# Patient Record
Sex: Female | Born: 1965
Health system: Southern US, Community
[De-identification: ages and names within clinical notes are randomized; demographics above are authoritative.]

## PROBLEM LIST (undated history)

## (undated) DIAGNOSIS — I341 Nonrheumatic mitral (valve) prolapse: Secondary | ICD-10-CM

## (undated) HISTORY — DX: Nonrheumatic mitral (valve) prolapse: I34.1

---

## 2000-11-21 HISTORY — PX: TUBAL LIGATION: SHX77

## 2004-02-24 ENCOUNTER — Other Ambulatory Visit: Admission: RE | Admit: 2004-02-24 | Discharge: 2004-02-24 | Payer: Self-pay | Admitting: Gynecology

## 2004-03-30 ENCOUNTER — Encounter: Admission: RE | Admit: 2004-03-30 | Discharge: 2004-03-30 | Payer: Self-pay | Admitting: Gynecology

## 2005-02-24 ENCOUNTER — Other Ambulatory Visit: Admission: RE | Admit: 2005-02-24 | Discharge: 2005-02-24 | Payer: Self-pay | Admitting: Gynecology

## 2005-03-31 ENCOUNTER — Encounter: Admission: RE | Admit: 2005-03-31 | Discharge: 2005-03-31 | Payer: Self-pay | Admitting: Gynecology

## 2006-04-03 ENCOUNTER — Encounter: Admission: RE | Admit: 2006-04-03 | Discharge: 2006-04-03 | Payer: Self-pay | Admitting: Gynecology

## 2006-10-02 ENCOUNTER — Encounter: Admission: RE | Admit: 2006-10-02 | Discharge: 2006-10-02 | Payer: Self-pay | Admitting: Gynecology

## 2007-03-06 ENCOUNTER — Other Ambulatory Visit: Admission: RE | Admit: 2007-03-06 | Discharge: 2007-03-06 | Payer: Self-pay | Admitting: Gynecology

## 2007-04-05 ENCOUNTER — Encounter: Admission: RE | Admit: 2007-04-05 | Discharge: 2007-04-05 | Payer: Self-pay | Admitting: Gynecology

## 2007-09-06 ENCOUNTER — Other Ambulatory Visit: Admission: RE | Admit: 2007-09-06 | Discharge: 2007-09-06 | Payer: Self-pay | Admitting: Gynecology

## 2007-09-11 ENCOUNTER — Encounter: Admission: RE | Admit: 2007-09-11 | Discharge: 2007-09-11 | Payer: Self-pay | Admitting: Gynecology

## 2008-03-17 ENCOUNTER — Other Ambulatory Visit: Admission: RE | Admit: 2008-03-17 | Discharge: 2008-03-17 | Payer: Self-pay | Admitting: Gynecology

## 2008-04-07 ENCOUNTER — Encounter: Admission: RE | Admit: 2008-04-07 | Discharge: 2008-04-07 | Payer: Self-pay | Admitting: Gynecology

## 2009-04-09 ENCOUNTER — Encounter: Admission: RE | Admit: 2009-04-09 | Discharge: 2009-04-09 | Payer: Self-pay | Admitting: Family Medicine

## 2009-05-13 ENCOUNTER — Encounter: Payer: Self-pay | Admitting: Cardiology

## 2009-07-07 ENCOUNTER — Other Ambulatory Visit: Admission: RE | Admit: 2009-07-07 | Discharge: 2009-07-07 | Payer: Self-pay | Admitting: Family Medicine

## 2010-04-15 ENCOUNTER — Encounter: Admission: RE | Admit: 2010-04-15 | Discharge: 2010-04-15 | Payer: Self-pay | Admitting: Family Medicine

## 2011-02-15 ENCOUNTER — Other Ambulatory Visit (HOSPITAL_COMMUNITY)
Admission: RE | Admit: 2011-02-15 | Discharge: 2011-02-15 | Disposition: A | Discharge status: Home / Self Care | Payer: Managed Care, Other (non HMO) | Source: Ambulatory Visit | Attending: Family Medicine | Admitting: Family Medicine

## 2011-02-15 ENCOUNTER — Other Ambulatory Visit: Payer: Self-pay | Admitting: Family Medicine

## 2011-02-15 DIAGNOSIS — Z124 Encounter for screening for malignant neoplasm of cervix: Secondary | ICD-10-CM | POA: Insufficient documentation

## 2011-03-08 ENCOUNTER — Other Ambulatory Visit: Payer: Self-pay | Admitting: Family Medicine

## 2011-03-08 DIAGNOSIS — Z1231 Encounter for screening mammogram for malignant neoplasm of breast: Secondary | ICD-10-CM

## 2011-04-19 ENCOUNTER — Ambulatory Visit
Admission: RE | Admit: 2011-04-19 | Discharge: 2011-04-19 | Disposition: A | Discharge status: Home / Self Care | Payer: Managed Care, Other (non HMO) | Source: Ambulatory Visit | Attending: Family Medicine | Admitting: Family Medicine

## 2011-04-19 DIAGNOSIS — Z1231 Encounter for screening mammogram for malignant neoplasm of breast: Secondary | ICD-10-CM

## 2012-03-16 ENCOUNTER — Other Ambulatory Visit: Payer: Self-pay | Admitting: Family Medicine

## 2012-03-16 DIAGNOSIS — Z1231 Encounter for screening mammogram for malignant neoplasm of breast: Secondary | ICD-10-CM

## 2012-04-19 ENCOUNTER — Ambulatory Visit
Admission: RE | Admit: 2012-04-19 | Discharge: 2012-04-19 | Disposition: A | Discharge status: Home / Self Care | Payer: Managed Care, Other (non HMO) | Source: Ambulatory Visit | Attending: Family Medicine | Admitting: Family Medicine

## 2012-04-19 DIAGNOSIS — Z1231 Encounter for screening mammogram for malignant neoplasm of breast: Secondary | ICD-10-CM

## 2013-02-18 ENCOUNTER — Other Ambulatory Visit: Payer: Self-pay

## 2013-02-18 DIAGNOSIS — Z1231 Encounter for screening mammogram for malignant neoplasm of breast: Secondary | ICD-10-CM

## 2013-04-18 ENCOUNTER — Ambulatory Visit
Admission: RE | Admit: 2013-04-18 | Discharge: 2013-04-18 | Disposition: A | Discharge status: Home / Self Care | Payer: BC Managed Care – PPO | Source: Ambulatory Visit

## 2013-04-18 DIAGNOSIS — Z1231 Encounter for screening mammogram for malignant neoplasm of breast: Secondary | ICD-10-CM

## 2013-10-25 ENCOUNTER — Encounter: Payer: Self-pay | Admitting: Cardiology

## 2014-02-04 ENCOUNTER — Other Ambulatory Visit: Payer: Self-pay

## 2014-02-04 DIAGNOSIS — Z1231 Encounter for screening mammogram for malignant neoplasm of breast: Secondary | ICD-10-CM

## 2014-02-20 ENCOUNTER — Other Ambulatory Visit: Payer: Self-pay | Admitting: Family Medicine

## 2014-02-20 ENCOUNTER — Other Ambulatory Visit (HOSPITAL_COMMUNITY)
Admission: RE | Admit: 2014-02-20 | Discharge: 2014-02-20 | Disposition: A | Discharge status: Home / Self Care | Payer: BC Managed Care – PPO | Source: Ambulatory Visit | Attending: Family Medicine | Admitting: Family Medicine

## 2014-02-20 DIAGNOSIS — Z124 Encounter for screening for malignant neoplasm of cervix: Secondary | ICD-10-CM | POA: Insufficient documentation

## 2014-02-20 DIAGNOSIS — Z1151 Encounter for screening for human papillomavirus (HPV): Secondary | ICD-10-CM | POA: Insufficient documentation

## 2014-02-20 DIAGNOSIS — Z0000 Encounter for general adult medical examination without abnormal findings: Secondary | ICD-10-CM | POA: Insufficient documentation

## 2014-04-21 ENCOUNTER — Encounter (INDEPENDENT_AMBULATORY_CARE_PROVIDER_SITE_OTHER): Payer: Self-pay

## 2014-04-21 ENCOUNTER — Ambulatory Visit
Admission: RE | Admit: 2014-04-21 | Discharge: 2014-04-21 | Disposition: A | Discharge status: Home / Self Care | Payer: BC Managed Care – PPO | Source: Ambulatory Visit

## 2014-04-21 DIAGNOSIS — Z1231 Encounter for screening mammogram for malignant neoplasm of breast: Secondary | ICD-10-CM

## 2014-12-16 ENCOUNTER — Telehealth: Payer: Self-pay | Admitting: Interventional Cardiology

## 2014-12-16 NOTE — Telephone Encounter (Signed)
Returned pt call. Pt ha spoken with her pcp Dr.Wendy Leonides Schanz she informed her that her last echo 5 yrs showed a very mild regurgatation, she does not think an echo needs to be repeated at this time unless the pt is having symptoms. Pt has cancelled her Consult appt with Dr.Smith for April. She will continue to be followed by her pcp. She will call back in the future to reschedule a new pt appt if symptoms develop, or if recommended by her pcp.

## 2014-12-16 NOTE — Telephone Encounter (Signed)
New message     Patient is a new patient and would like to know if she would need to have an echo before the appt.   Please call

## 2015-02-09 ENCOUNTER — Ambulatory Visit: Payer: Self-pay | Admitting: Interventional Cardiology

## 2015-02-18 ENCOUNTER — Other Ambulatory Visit: Payer: Self-pay

## 2015-02-18 DIAGNOSIS — Z1231 Encounter for screening mammogram for malignant neoplasm of breast: Secondary | ICD-10-CM

## 2015-03-02 ENCOUNTER — Ambulatory Visit: Payer: Self-pay | Admitting: Interventional Cardiology

## 2015-04-23 ENCOUNTER — Ambulatory Visit
Admission: RE | Admit: 2015-04-23 | Discharge: 2015-04-23 | Disposition: A | Discharge status: Home / Self Care | Payer: BLUE CROSS/BLUE SHIELD | Source: Ambulatory Visit

## 2015-04-23 DIAGNOSIS — Z1231 Encounter for screening mammogram for malignant neoplasm of breast: Secondary | ICD-10-CM

## 2015-12-01 ENCOUNTER — Encounter: Payer: Self-pay | Admitting: Interventional Cardiology

## 2015-12-01 ENCOUNTER — Ambulatory Visit (INDEPENDENT_AMBULATORY_CARE_PROVIDER_SITE_OTHER): Payer: BLUE CROSS/BLUE SHIELD | Admitting: Interventional Cardiology

## 2015-12-01 VITALS — BP 100/80 | HR 53 | Ht 69.0 in | Wt 140.8 lb

## 2015-12-01 DIAGNOSIS — F419 Anxiety disorder, unspecified: Secondary | ICD-10-CM | POA: Diagnosis not present

## 2015-12-01 DIAGNOSIS — R0600 Dyspnea, unspecified: Secondary | ICD-10-CM | POA: Diagnosis not present

## 2015-12-01 DIAGNOSIS — I340 Nonrheumatic mitral (valve) insufficiency: Secondary | ICD-10-CM | POA: Diagnosis not present

## 2015-12-01 NOTE — Patient Instructions (Signed)
Medication Instructions:  Your physician recommends that you continue on your current medications as directed. Please refer to the Current Medication list given to you today.   Labwork: None ordered  Testing/Procedures: Your physician has requested that you have an echocardiogram. Echocardiography is a painless test that uses sound waves to create images of your heart. It provides your doctor with information about the size and shape of your heart and how well your heart's chambers and valves are working. This procedure takes approximately one hour. There are no restrictions for this procedure.    Follow-Up: Your physician wants you to follow-up in: 1 year with Dr.Smith You will receive a reminder letter in the mail two months in advance. If you don't receive a letter, please call our office to schedule the follow-up appointment.   Any Other Special Instructions Will Be Listed Below (If Applicable).     If you need a refill on your cardiac medications before your next appointment, please call your pharmacy.   

## 2015-12-01 NOTE — Progress Notes (Signed)
Cardiology Office Note   Date:  12/01/2015   ID:  Christine Bell, DOB 1966/07/27, MRN [id number]  PCP:  Cari Caraway, MD  Cardiologist:  Sinclair Grooms, MD   Chief Complaint  Patient presents with  . Mitral Regurgitation      History of Present Illness: Christine Bell is a 50 y.o. female who presents for  Family history of mitral valve prolapse as well as a personal history of prolapse presenting with increased dyspnea.   Patient is doing well. She denies orthopnea, PND, lower extremity swelling, and chest discomfort. No significant palpitations. Long-standing history of mitral valve prolapse. Documented both possible palpitations and an echocardiogram. Last echo greater than 5 years ago. She denies chills and fever. Previously followed by Dr. Romeo Apple. She has never had syncope of prolonged palpitations.    Past Medical History  Diagnosis Date  . Mitral valve prolapse     Past Surgical History  Procedure Laterality Date  . Tubal ligation  2002     Current Outpatient Prescriptions  Medication Sig Dispense Refill  . FLUoxetine (PROZAC) 20 MG capsule Take 20 mg by mouth every morning.  0  . IBUPROFEN PO Take 600 mg by mouth 4 (four) times daily as needed. (PAIN)     No current facility-administered medications for this visit.    Allergies:   Bupropion    Social History:  The patient  reports that she has never smoked. She has never used smokeless tobacco.   Family History:  The patient's family history includes Arthritis in her mother; Breast cancer in her mother; Gout in her brother; Healthy in her brother, sister, and sister; Hyperlipidemia in her mother; Other in her father.  Father has mitral valve replacement due to mitral valve prolapse. Also history of atrial fibrillation.   ROS:  Please see the history of present illness.   Otherwise, review of systems are positive for father has a history of mitral valve prolapse with subsequent atrial  fibrillation and mitral valve replacement. History of hypertension other. Occasional depression and anxiety. Now back on serotonin uptake inhibitor therapy.   All other systems are reviewed and negative.    PHYSICAL EXAM: VS:  BP 100/80 mmHg  Pulse 53  Ht 5\' 9"  (1.753 m)  Wt 140 lb 12.8 oz (63.866 kg)  BMI 20.78 kg/m2 , BMI Body mass index is 20.78 kg/(m^2). GEN: Well nourished, well developed, in no acute distress HEENT: normal Neck: no JVD, carotid bruits, or masses Cardiac: RRR.  There  1-2 of 6 apical late systolic murmur and multiple late systolic clicks. Physiologic splitting of the S2. Marland Kitchen There is norub, or gallop. There is  no edema. Respiratory:  clear to auscultation bilaterally  withnormal work of breathing. GI: soft, nontender, nondistended, + BS MS: no deformity or atrophy Skin: warm and dry, no rash Neuro:  Strength and sensation are intact Psych: euthymic mood, full affect   EKG:  EKG is ordered today. The ekg reveals  Sinus bradycardia with R prime in V1. Normal conduction duration.   Recent Labs: No results found for requested labs within last 365 days.    Lipid Panel No results found for: CHOL, TRIG, HDL, CHOLHDL, VLDL, LDLCALC, LDLDIRECT    Wt Readings from Last 3 Encounters:  12/01/15 140 lb 12.8 oz (63.866 kg)      Other studies Reviewed: Additional studies/ records that were reviewed today include: None. The findings include none.    ASSESSMENT AND PLAN:  1. Mitral  regurgitation , related to mitral valve prolapse Classical auscultatory findings of mitral valve prolapse. No evidence of volume overload or CHF.   2. Anxiety disorder  3. Dyspnea No evidence of CHF or cardiac explanation.  Current medicines are reviewed at length with the patient today.  The patient has the following concerns regarding medicines: .  The following changes/actions have been instituted:    2-D Doppler echocardiogram  Further evaluation may be necessary depending  upon echo findings.  Labs/ tests ordered today include:  No orders of the defined types were placed in this encounter.     Disposition:   FU with HS in 1  year  Signed, Sinclair Grooms, MD  12/01/2015 10:36 AM    [facility] [place], [place], [place]  [address] Phone: [phone]; Fax: [phone]

## 2015-12-02 NOTE — Addendum Note (Signed)
Addended by: Freada Bergeron on: 12/02/2015 04:50 PM   Modules accepted: Orders

## 2015-12-14 ENCOUNTER — Other Ambulatory Visit: Payer: Self-pay

## 2015-12-14 ENCOUNTER — Ambulatory Visit (HOSPITAL_COMMUNITY): Payer: BLUE CROSS/BLUE SHIELD | Attending: Internal Medicine

## 2015-12-14 DIAGNOSIS — I071 Rheumatic tricuspid insufficiency: Secondary | ICD-10-CM | POA: Insufficient documentation

## 2015-12-14 DIAGNOSIS — I351 Nonrheumatic aortic (valve) insufficiency: Secondary | ICD-10-CM | POA: Diagnosis not present

## 2015-12-14 DIAGNOSIS — I340 Nonrheumatic mitral (valve) insufficiency: Secondary | ICD-10-CM | POA: Diagnosis not present

## 2015-12-14 DIAGNOSIS — I341 Nonrheumatic mitral (valve) prolapse: Secondary | ICD-10-CM | POA: Insufficient documentation

## 2015-12-14 DIAGNOSIS — I700 Atherosclerosis of aorta: Secondary | ICD-10-CM | POA: Insufficient documentation

## 2015-12-17 ENCOUNTER — Telehealth: Payer: Self-pay | Admitting: Interventional Cardiology

## 2015-12-17 NOTE — Telephone Encounter (Signed)
-----   Message from Belva Crome, MD sent at 12/15/2015 10:20 PM EST ----- Mild to moderate Mitral regurgitation due to MVP. Normal heart size. Mild increase in pulmonary artery pressure 43 mmHg Will need to follow and I suggest repeat echo in 1 year to follow pulmonary preesures.

## 2015-12-17 NOTE — Telephone Encounter (Signed)
Returned pt call.[email or web address] 

## 2015-12-17 NOTE — Telephone Encounter (Signed)
Mrs. Magdaleno is returning your call

## 2015-12-18 NOTE — Telephone Encounter (Signed)
F/u   Pt returning call from [place].

## 2015-12-18 NOTE — Telephone Encounter (Signed)
Called pt and reviewed results. Questions asked and answered. Pt will call with further needs.

## 2015-12-21 ENCOUNTER — Other Ambulatory Visit: Payer: Self-pay

## 2015-12-21 DIAGNOSIS — I341 Nonrheumatic mitral (valve) prolapse: Secondary | ICD-10-CM

## 2016-02-24 ENCOUNTER — Other Ambulatory Visit (HOSPITAL_COMMUNITY): Payer: Self-pay | Admitting: Family Medicine

## 2016-02-24 DIAGNOSIS — N938 Other specified abnormal uterine and vaginal bleeding: Secondary | ICD-10-CM

## 2016-02-26 ENCOUNTER — Ambulatory Visit (HOSPITAL_COMMUNITY)
Admission: RE | Admit: 2016-02-26 | Discharge: 2016-02-26 | Disposition: A | Discharge status: Home / Self Care | Payer: BLUE CROSS/BLUE SHIELD | Source: Ambulatory Visit | Attending: Family Medicine | Admitting: Family Medicine

## 2016-02-26 DIAGNOSIS — N938 Other specified abnormal uterine and vaginal bleeding: Secondary | ICD-10-CM | POA: Diagnosis not present

## 2016-02-26 DIAGNOSIS — D251 Intramural leiomyoma of uterus: Secondary | ICD-10-CM | POA: Insufficient documentation

## 2016-03-11 ENCOUNTER — Other Ambulatory Visit: Payer: Self-pay

## 2016-03-11 DIAGNOSIS — Z1231 Encounter for screening mammogram for malignant neoplasm of breast: Secondary | ICD-10-CM

## 2016-04-25 ENCOUNTER — Ambulatory Visit
Admission: RE | Admit: 2016-04-25 | Discharge: 2016-04-25 | Disposition: A | Discharge status: Home / Self Care | Payer: BLUE CROSS/BLUE SHIELD | Source: Ambulatory Visit

## 2016-04-25 DIAGNOSIS — Z1231 Encounter for screening mammogram for malignant neoplasm of breast: Secondary | ICD-10-CM

## 2016-09-12 DIAGNOSIS — F3342 Major depressive disorder, recurrent, in full remission: Secondary | ICD-10-CM | POA: Diagnosis not present

## 2016-09-12 DIAGNOSIS — I341 Nonrheumatic mitral (valve) prolapse: Secondary | ICD-10-CM | POA: Diagnosis not present

## 2016-09-12 DIAGNOSIS — Z0000 Encounter for general adult medical examination without abnormal findings: Secondary | ICD-10-CM | POA: Diagnosis not present

## 2016-10-19 DIAGNOSIS — H11442 Conjunctival cysts, left eye: Secondary | ICD-10-CM | POA: Diagnosis not present

## 2016-10-28 DIAGNOSIS — H11442 Conjunctival cysts, left eye: Secondary | ICD-10-CM | POA: Diagnosis not present

## 2016-10-28 DIAGNOSIS — H11152 Pinguecula, left eye: Secondary | ICD-10-CM | POA: Diagnosis not present

## 2016-12-08 ENCOUNTER — Other Ambulatory Visit (HOSPITAL_COMMUNITY): Payer: BLUE CROSS/BLUE SHIELD

## 2016-12-09 ENCOUNTER — Ambulatory Visit (HOSPITAL_COMMUNITY)
Admission: RE | Admit: 2016-12-09 | Discharge: 2016-12-09 | Disposition: A | Discharge status: Home / Self Care | Payer: BLUE CROSS/BLUE SHIELD | Source: Ambulatory Visit | Attending: Interventional Cardiology | Admitting: Interventional Cardiology

## 2016-12-09 ENCOUNTER — Other Ambulatory Visit (HOSPITAL_COMMUNITY): Payer: BLUE CROSS/BLUE SHIELD

## 2016-12-09 DIAGNOSIS — I341 Nonrheumatic mitral (valve) prolapse: Secondary | ICD-10-CM

## 2016-12-09 DIAGNOSIS — I351 Nonrheumatic aortic (valve) insufficiency: Secondary | ICD-10-CM | POA: Diagnosis not present

## 2016-12-09 NOTE — Progress Notes (Signed)
  Echocardiogram 2D Echocardiogram has been performed.  Jennette Dubin 12/09/2016, 3:52 PM

## 2016-12-13 ENCOUNTER — Ambulatory Visit: Payer: BLUE CROSS/BLUE SHIELD | Admitting: Interventional Cardiology

## 2016-12-13 ENCOUNTER — Ambulatory Visit (INDEPENDENT_AMBULATORY_CARE_PROVIDER_SITE_OTHER): Payer: BLUE CROSS/BLUE SHIELD | Admitting: Interventional Cardiology

## 2016-12-13 ENCOUNTER — Encounter: Payer: Self-pay | Admitting: Interventional Cardiology

## 2016-12-13 ENCOUNTER — Encounter (INDEPENDENT_AMBULATORY_CARE_PROVIDER_SITE_OTHER): Payer: Self-pay

## 2016-12-13 VITALS — BP 110/74 | HR 53 | Ht 69.0 in | Wt 146.2 lb

## 2016-12-13 DIAGNOSIS — F064 Anxiety disorder due to known physiological condition: Secondary | ICD-10-CM | POA: Diagnosis not present

## 2016-12-13 DIAGNOSIS — I340 Nonrheumatic mitral (valve) insufficiency: Secondary | ICD-10-CM

## 2016-12-13 NOTE — Patient Instructions (Signed)

## 2016-12-13 NOTE — Progress Notes (Signed)
Cardiology Office Note    Date:  12/13/2016   ID:  ANAILAH MOKRY, DOB 05/26/1966, MRN [id number]  PCP:  Cari Caraway, MD  Cardiologist: Sinclair Grooms, MD   Chief Complaint  Patient presents with  . Cardiac Valve Problem    History of Present Illness:  Christine Bell is a 51 y.o. female presents for Family history of mitral valve prolapse as well as a personal history of prolapse presenting with increased dyspnea.  She has not been exercising regularly. She denies any cardiopulmonary complaints. The recent echocardiogram detailed below was very reassuring   Past Medical History:  Diagnosis Date  . Mitral valve prolapse     Past Surgical History:  Procedure Laterality Date  . TUBAL LIGATION  2002    Current Medications: Outpatient Medications Prior to Visit  Medication Sig Dispense Refill  . FLUoxetine (PROZAC) 20 MG capsule Take 20 mg by mouth every morning.  0  . IBUPROFEN PO Take 600 mg by mouth 4 (four) times daily as needed. (PAIN)     No facility-administered medications prior to visit.      Allergies:   Bupropion   Social History   Social History  . Marital status: Married    Spouse name: N/A  . Number of children: N/A  . Years of education: N/A   Social History Main Topics  . Smoking status: Never Smoker  . Smokeless tobacco: Never Used  . Alcohol use None  . Drug use: Unknown  . Sexual activity: Not Asked   Other Topics Concern  . None   Social History Narrative  . None     Family History:  The patient's family history includes Arthritis in her mother; Breast cancer in her mother; Gout in her brother; Healthy in her brother, sister, and sister; Hyperlipidemia in her mother; Other in her father.   ROS:   Please see the history of present illness.    Discouraged that she has become relatively sedentary and promises to improve her level of activity.  All other systems reviewed and are negative.   PHYSICAL EXAM:   VS:  BP 110/74 (BP  Location: Right Arm)   Pulse (!) 53   Ht 5\' 9"  (1.753 m)   Wt 146 lb 3.2 oz (66.3 kg)   BMI 21.59 kg/m    GEN: Well nourished, well developed, in no acute distress  HEENT: normal  Neck: no JVD, carotid bruits, or masses Cardiac: RRR; no murmurs, rubs, or gallops,no edema . There are multiple mid to late systolic clicks without a murmur being audible. Respiratory:  clear to auscultation bilaterally, normal work of breathing GI: soft, nontender, nondistended, + BS MS: no deformity or atrophy  Skin: warm and dry, no rash Neuro:  Alert and Oriented x 3, Strength and sensation are intact Psych: euthymic mood, full affect  Wt Readings from Last 3 Encounters:  12/13/16 146 lb 3.2 oz (66.3 kg)  12/01/15 140 lb 12.8 oz (63.9 kg)      Studies/Labs Reviewed:   EKG:  EKG  Sinus bradycardia at 53 bpm. Otherwise unremarkable.  Recent Labs: No results found for requested labs within last 8760 hours.   Lipid Panel No results found for: CHOL, TRIG, HDL, CHOLHDL, VLDL, LDLCALC, LDLDIRECT  Additional studies/ records that were reviewed today include:   Echocardiogram 12/09/16: Study Conclusions  - Left ventricle: The cavity size was normal. Wall thickness was   normal. Systolic function was normal. The estimated ejection   fraction was  in the range of 55% to 60%. Wall motion was normal;   there were no regional wall motion abnormalities. Left   ventricular diastolic function parameters were normal. - Aortic valve: There was trivial regurgitation. - Mitral valve: Prolapse. Mild prolapse, involving the anterior   leaflet and the posterior leaflet.   ASSESSMENT:    1. 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 Mitral valve prolapse is documented by the recent echo. No significant regurgitation is noted. Bileaflet prolapse is noted. We discussed the natural history of mitral valve  prolapse. It is a positive prognostic indicator that she has no regurgitation at this time in her life. We did discuss the possibility of chordal rupture. Return in one year follow auscultation and clinical follow-up. Return earlier if developed sudden shortness of breath.    Medication Adjustments/Labs and Tests Ordered: Current medicines are reviewed at length with the patient today.  Concerns regarding medicines are outlined above.  Medication changes, Labs and Tests ordered today are listed in the Patient Instructions below. There are no Patient Instructions on file for this visit.   Signed, Sinclair Grooms, MD  12/13/2016 9:04 AM    [facility] [place], [place], [place]  [address] Phone: [phone]; Fax: [phone]

## 2016-12-22 ENCOUNTER — Other Ambulatory Visit (HOSPITAL_COMMUNITY): Payer: BLUE CROSS/BLUE SHIELD

## 2017-03-10 DIAGNOSIS — F341 Dysthymic disorder: Secondary | ICD-10-CM | POA: Diagnosis not present

## 2017-03-16 ENCOUNTER — Other Ambulatory Visit: Payer: Self-pay | Admitting: Family Medicine

## 2017-03-16 DIAGNOSIS — Z1231 Encounter for screening mammogram for malignant neoplasm of breast: Secondary | ICD-10-CM

## 2017-05-01 ENCOUNTER — Ambulatory Visit
Admission: RE | Admit: 2017-05-01 | Discharge: 2017-05-01 | Disposition: A | Discharge status: Home / Self Care | Payer: BLUE CROSS/BLUE SHIELD | Source: Ambulatory Visit | Attending: Family Medicine | Admitting: Family Medicine

## 2017-05-01 DIAGNOSIS — Z1231 Encounter for screening mammogram for malignant neoplasm of breast: Secondary | ICD-10-CM | POA: Diagnosis not present

## 2017-07-20 DIAGNOSIS — F341 Dysthymic disorder: Secondary | ICD-10-CM | POA: Diagnosis not present

## 2017-09-13 DIAGNOSIS — Z13220 Encounter for screening for lipoid disorders: Secondary | ICD-10-CM | POA: Diagnosis not present

## 2017-09-13 DIAGNOSIS — Z130 Encounter for screening for diseases of the blood and blood-forming organs and certain disorders involving the immune mechanism: Secondary | ICD-10-CM | POA: Diagnosis not present

## 2017-09-13 DIAGNOSIS — Z0000 Encounter for general adult medical examination without abnormal findings: Secondary | ICD-10-CM | POA: Diagnosis not present

## 2017-09-13 DIAGNOSIS — Z131 Encounter for screening for diabetes mellitus: Secondary | ICD-10-CM | POA: Diagnosis not present

## 2018-03-06 DIAGNOSIS — F341 Dysthymic disorder: Secondary | ICD-10-CM | POA: Diagnosis not present

## 2018-03-23 ENCOUNTER — Other Ambulatory Visit: Payer: Self-pay | Admitting: Family Medicine

## 2018-03-23 DIAGNOSIS — Z1231 Encounter for screening mammogram for malignant neoplasm of breast: Secondary | ICD-10-CM

## 2018-05-04 ENCOUNTER — Ambulatory Visit
Admission: RE | Admit: 2018-05-04 | Discharge: 2018-05-04 | Disposition: A | Discharge status: Home / Self Care | Payer: BLUE CROSS/BLUE SHIELD | Source: Ambulatory Visit | Attending: Family Medicine | Admitting: Family Medicine

## 2018-05-04 DIAGNOSIS — Z1231 Encounter for screening mammogram for malignant neoplasm of breast: Secondary | ICD-10-CM

## 2018-08-20 DIAGNOSIS — D485 Neoplasm of uncertain behavior of skin: Secondary | ICD-10-CM | POA: Diagnosis not present

## 2018-08-20 DIAGNOSIS — C44319 Basal cell carcinoma of skin of other parts of face: Secondary | ICD-10-CM | POA: Diagnosis not present

## 2018-08-20 DIAGNOSIS — L570 Actinic keratosis: Secondary | ICD-10-CM | POA: Diagnosis not present

## 2018-08-20 DIAGNOSIS — L821 Other seborrheic keratosis: Secondary | ICD-10-CM | POA: Diagnosis not present

## 2018-08-24 DIAGNOSIS — F341 Dysthymic disorder: Secondary | ICD-10-CM | POA: Diagnosis not present

## 2018-09-21 ENCOUNTER — Other Ambulatory Visit: Payer: Self-pay | Admitting: Family Medicine

## 2018-09-21 ENCOUNTER — Other Ambulatory Visit (HOSPITAL_COMMUNITY)
Admission: RE | Admit: 2018-09-21 | Discharge: 2018-09-21 | Disposition: A | Discharge status: Home / Self Care | Payer: BLUE CROSS/BLUE SHIELD | Source: Ambulatory Visit | Attending: Family Medicine | Admitting: Family Medicine

## 2018-09-21 DIAGNOSIS — Z124 Encounter for screening for malignant neoplasm of cervix: Secondary | ICD-10-CM | POA: Insufficient documentation

## 2018-09-21 DIAGNOSIS — Z0000 Encounter for general adult medical examination without abnormal findings: Secondary | ICD-10-CM | POA: Diagnosis not present

## 2018-09-21 DIAGNOSIS — Z23 Encounter for immunization: Secondary | ICD-10-CM | POA: Diagnosis not present

## 2018-09-25 LAB — CYTOLOGY - PAP
Diagnosis: NEGATIVE
HPV: NOT DETECTED

## 2018-09-27 DIAGNOSIS — C441192 Basal cell carcinoma of skin of left lower eyelid, including canthus: Secondary | ICD-10-CM | POA: Diagnosis not present

## 2018-10-24 DIAGNOSIS — T1511XA Foreign body in conjunctival sac, right eye, initial encounter: Secondary | ICD-10-CM | POA: Diagnosis not present

## 2018-11-26 DIAGNOSIS — F341 Dysthymic disorder: Secondary | ICD-10-CM | POA: Diagnosis not present

## 2019-03-22 ENCOUNTER — Telehealth: Payer: Self-pay | Admitting: Interventional Cardiology

## 2019-03-22 NOTE — Telephone Encounter (Signed)
New message:    Patient calling to see if she need to come in sooner for a appt patient is not feeling well. Please call patient.

## 2019-03-22 NOTE — Telephone Encounter (Signed)
Spoke with pt and she states that she has recently noticed SOB when climbing stairs and when she feels her heartbeat she feels "skips" sporadically.  Denies CP, dizziness, lightheadedness, SOB at rest or other cardiac issues.  States she also gets SOB when laying on her stomach but no issues on back or side.  Denies swelling or weight change.  States no issues when walking around or doing yard work.  Pt inquired about doing a virtual visit sooner than her scheduled Aug appt.  Scheduled pt for video visit with Dr. Tamala Julian on 03/27/2019.  Pt appreciative for call.

## 2019-03-26 ENCOUNTER — Other Ambulatory Visit: Payer: Self-pay | Admitting: Family Medicine

## 2019-03-26 ENCOUNTER — Telehealth: Payer: Self-pay

## 2019-03-26 DIAGNOSIS — Z1231 Encounter for screening mammogram for malignant neoplasm of breast: Secondary | ICD-10-CM

## 2019-03-26 NOTE — Progress Notes (Signed)
Virtual Visit via Video Note   This visit type was conducted due to national recommendations for restrictions regarding the COVID-19 Pandemic (e.g. social distancing) in an effort to limit this patient's exposure and mitigate transmission in our community.  Due to her co-morbid illnesses, this patient is at least at moderate risk for complications without adequate follow up.  This format is felt to be most appropriate for this patient at this time.  All issues noted in this document were discussed and addressed.  A limited physical exam was performed with this format.  Please refer to the patient's chart for her consent to telehealth for [facility].   Date:  03/26/2019   ID:  Christine Bell, DOB 08-26-1966, MRN [id number]  Patient Location: Home Provider Location: Office  PCP:  Cari Caraway, MD  Cardiologist:  No primary care provider on file.  Electrophysiologist:  None   Evaluation Performed:  Follow-Up Visit  Chief Complaint:  Palpitations. MVP  History of Present Illness:    Christine Bell is a 53 y.o. female with Family history of mitral valve prolapse as well as a personal history of prolapse presenting with increased dyspnea. Now with c/o palpitations.  Kimiyah has a history of mitral valve prolapse.  Over the last 2 weeks she has noted irregular heartbeat and racing heart when she climbs stairs that she ordinarily ambulates without difficulty.  She subsequently has auscultated her heart and realizes that her heart rhythm is irregular.  This caused her to have concern and is the reason for today's visit.  She denies orthopnea, PND, ankle swelling, chest pain, fainting, and tachycardia.  The patient does have symptoms concerning for COVID-19 infection (fever, chills, cough, or new shortness of breath).    Past Medical History:  Diagnosis Date  . Mitral valve prolapse    Past Surgical History:  Procedure Laterality Date  . TUBAL LIGATION  2002     No outpatient  medications have been marked as taking for the 03/27/19 encounter (Appointment) with Belva Crome, MD.     Allergies:   Bupropion   Social History   Tobacco Use  . Smoking status: Never Smoker  . Smokeless tobacco: Never Used  Substance Use Topics  . Alcohol use: Not on file  . Drug use: Not on file     Family Hx: The patient's family history includes Arthritis in her mother; Breast cancer in her mother; Gout in her brother; Healthy in her brother, sister, and sister; Hyperlipidemia in her mother; Other in her father.  ROS:   Please see the history of present illness.    She otherwise feels well. All other systems reviewed and are negative.   Prior CV studies:   The following studies were reviewed today:  ECHOCARDIOGRAM 12/09/2016: Study Conclusions  - Left ventricle: The cavity size was normal. Wall thickness was   normal. Systolic function was normal. The estimated ejection   fraction was in the range of 55% to 60%. Wall motion was normal;   there were no regional wall motion abnormalities. Left   ventricular diastolic function parameters were normal. - Aortic valve: There was trivial regurgitation. - Mitral valve: Prolapse. Mild prolapse, involving the anterior   leaflet and the posterior leaflet.  Labs/Other Tests and Data Reviewed:    EKG:  No ECG reviewed.  Recent Labs: No results found for requested labs within last 8760 hours.   Recent Lipid Panel No results found for: CHOL, TRIG, HDL, CHOLHDL, LDLCALC, LDLDIRECT  Wt  Readings from Last 3 Encounters:  12/13/16 146 lb 3.2 oz (66.3 kg)  12/01/15 140 lb 12.8 oz (63.9 kg)     Objective:    Vital Signs:  There were no vitals taken for this visit.   VITAL SIGNS:  reviewed GEN:  no acute distress RESPIRATORY:  normal respiratory effort, symmetric expansion CARDIOVASCULAR:  no peripheral edema NEURO:  alert and oriented x 3, no obvious focal deficit  ASSESSMENT & PLAN:    1. Nonrheumatic mitral valve  regurgitation   2. Palpitations   3. 2019 novel coronavirus disease (COVID-19)    PLAN:  1. 2D Doppler echo to assess mitral valve and rule out progression of regurgitation. 2. 14-day monitor with the patient participating in her usual activities to rule out atrial fibrillation or other arrhythmias.  Plan follow-up office visit once daily is back.  COVID-19 Education: The signs and symptoms of COVID-19 were discussed with the patient and how to seek care for testing (follow up with PCP or arrange E-visit).  The importance of social distancing was discussed today.  Time:   Today, I have spent 20 minutes with the patient with telehealth technology discussing the above problems.     Medication Adjustments/Labs and Tests Ordered: Current medicines are reviewed at length with the patient today.  Concerns regarding medicines are outlined above.   Tests Ordered: No orders of the defined types were placed in this encounter.   Medication Changes: No orders of the defined types were placed in this encounter.   Disposition:  Follow up in 6 week(s)  Signed, Sinclair Grooms, MD  03/26/2019 9:17 PM    [facility]

## 2019-03-26 NOTE — Telephone Encounter (Signed)
YOUR CARDIOLOGY TEAM HAS ARRANGED FOR AN E-VISIT FOR YOUR APPOINTMENT - PLEASE REVIEW IMPORTANT INFORMATION BELOW SEVERAL DAYS PRIOR TO YOUR APPOINTMENT  Due to the recent COVID-19 pandemic, we are transitioning in-person office visits to tele-medicine visits in an effort to decrease unnecessary exposure to our patients, their families, and staff. These visits are billed to your insurance just like a normal visit is. We also encourage you to sign up for MyChart if you have not already done so. You will need a smartphone if possible. For patients that do not have this, we can still complete the visit using a regular telephone but do prefer a smartphone to enable video when possible. You may have a family member that lives with you that can help. If possible, we also ask that you have a blood pressure cuff and scale at home to measure your blood pressure, heart rate and weight prior to your scheduled appointment. Patients with clinical needs that need an in-person evaluation and testing will still be able to come to the office if absolutely necessary. If you have any questions, feel free to call our office.     YOUR PROVIDER WILL BE USING THE FOLLOWING PLATFORM TO COMPLETE YOUR VISIT: Doximity  . IF USING MYCHART - How to Download the MyChart App to Your SmartPhone   - If Apple, go to App Store and type in MyChart in the search bar and download the app. If Android, ask patient to go to Google Play Store and type in MyChart in the search bar and download the app. The app is free but as with any other app downloads, your phone may require you to verify saved payment information or Apple/Android password.  - You will need to then log into the app with your MyChart username and password, and select [facility] as your healthcare provider to link the account.  - When it is time for your visit, go to the MyChart app, find appointments, and click Begin Video Visit. Be sure to Select Allow for your device to  access the Microphone and Camera for your visit. You will then be connected, and your provider will be with you shortly.  **If you have any issues connecting or need assistance, please contact MyChart service desk [phone] ([phone])**  **If using a computer, in order to ensure the best quality for your visit, you will need to use either of the following Internet Browsers: Google Chrome or Microsoft Edge**  . IF USING DOXIMITY or DOXY.ME - The staff will give you instructions on receiving your link to join the meeting the day of your visit.      2-3 DAYS BEFORE YOUR APPOINTMENT  You will receive a telephone call from one of our [facility] team members - your caller ID may say "Unknown caller." If this is a video visit, we will walk you through how to get the video launched on your phone. We will remind you check your blood pressure, heart rate and weight prior to your scheduled appointment. If you have an Apple Watch or Kardia, please upload any pertinent ECG strips the day before or morning of your appointment to MyChart. Our staff will also make sure you have reviewed the consent and agree to move forward with your scheduled tele-health visit.     THE DAY OF YOUR APPOINTMENT  Approximately 15 minutes prior to your scheduled appointment, you will receive a telephone call from one of HeartCare team - your caller ID may say "Unknown caller."    Our staff will confirm medications, vital signs for the day and any symptoms you may be experiencing. Please have this information available prior to the time of visit start. It may also be helpful for you to have a pad of paper and pen handy for any instructions given during your visit. They will also walk you through joining the smartphone meeting if this is a video visit.    CONSENT FOR TELE-HEALTH VISIT - PLEASE REVIEW  I hereby voluntarily request, consent and authorize [facility] and its employed or contracted physicians, physician  assistants, nurse practitioners or other licensed health care professionals (the Practitioner), to provide me with telemedicine health care services (the "Services") as deemed necessary by the treating Practitioner. I acknowledge and consent to receive the Services by the Practitioner via telemedicine. I understand that the telemedicine visit will involve communicating with the Practitioner through live audiovisual communication technology and the disclosure of certain medical information by electronic transmission. I acknowledge that I have been given the opportunity to request an in-person assessment or other available alternative prior to the telemedicine visit and am voluntarily participating in the telemedicine visit.  I understand that I have the right to withhold or withdraw my consent to the use of telemedicine in the course of my care at any time, without affecting my right to future care or treatment, and that the Practitioner or I may terminate the telemedicine visit at any time. I understand that I have the right to inspect all information obtained and/or recorded in the course of the telemedicine visit and may receive copies of available information for a reasonable fee.  I understand that some of the potential risks of receiving the Services via telemedicine include:  . Delay or interruption in medical evaluation due to technological equipment failure or disruption; . Information transmitted may not be sufficient (e.g. poor resolution of images) to allow for appropriate medical decision making by the Practitioner; and/or  . In rare instances, security protocols could fail, causing a breach of personal health information.  Furthermore, I acknowledge that it is my responsibility to provide information about my medical history, conditions and care that is complete and accurate to the best of my ability. I acknowledge that Practitioner's advice, recommendations, and/or decision may be based on  factors not within their control, such as incomplete or inaccurate data provided by me or distortions of diagnostic images or specimens that may result from electronic transmissions. I understand that the practice of medicine is not an exact science and that Practitioner makes no warranties or guarantees regarding treatment outcomes. I acknowledge that I will receive a copy of this consent concurrently upon execution via email to the email address I last provided but may also request a printed copy by calling the office of [facility].    I understand that my insurance will be billed for this visit.   I have read or had this consent read to me. . I understand the contents of this consent, which adequately explains the benefits and risks of the Services being provided via telemedicine.  . I have been provided ample opportunity to ask questions regarding this consent and the Services and have had my questions answered to my satisfaction. . I give my informed consent for the services to be provided through the use of telemedicine in my medical care  By participating in this telemedicine visit I agree to the above.  

## 2019-03-27 ENCOUNTER — Telehealth (INDEPENDENT_AMBULATORY_CARE_PROVIDER_SITE_OTHER): Payer: BLUE CROSS/BLUE SHIELD | Admitting: Interventional Cardiology

## 2019-03-27 ENCOUNTER — Telehealth: Payer: Self-pay | Admitting: Radiology

## 2019-03-27 ENCOUNTER — Other Ambulatory Visit: Payer: Self-pay

## 2019-03-27 ENCOUNTER — Encounter: Payer: Self-pay | Admitting: Interventional Cardiology

## 2019-03-27 VITALS — BP 101/60 | HR 56 | Ht 69.0 in | Wt 150.0 lb

## 2019-03-27 DIAGNOSIS — R002 Palpitations: Secondary | ICD-10-CM

## 2019-03-27 DIAGNOSIS — U071 COVID-19: Secondary | ICD-10-CM

## 2019-03-27 DIAGNOSIS — I340 Nonrheumatic mitral (valve) insufficiency: Secondary | ICD-10-CM

## 2019-03-27 NOTE — Telephone Encounter (Signed)
Enrolled patient for a 14 day [employer] monitor to be mailed. Patient knows to expect monitor to arrive in 2-3 days

## 2019-03-27 NOTE — Addendum Note (Signed)
Addended by: Loren Racer on: 03/27/2019 04:36 PM   Modules accepted: Orders

## 2019-03-27 NOTE — Patient Instructions (Signed)
Medication Instructions:  Your physician recommends that you continue on your current medications as directed. Please refer to the Current Medication list given to you today.  If you need a refill on your cardiac medications before your next appointment, please call your pharmacy.   Lab work: None If you have labs (blood work) drawn today and your tests are completely normal, you will receive your results only by: Marland Kitchen MyChart Message (if you have MyChart) OR . A paper copy in the mail If you have any lab test that is abnormal or we need to change your treatment, we will call you to review the results.  Testing/Procedures: Your physician has requested that you have an echocardiogram. Echocardiography is a painless test that uses sound waves to create images of your heart. It provides your doctor with information about the size and shape of your heart and how well your heart's chambers and valves are working. This procedure takes approximately one hour. There are no restrictions for this procedure.  Your physician has recommended that you wear an 14 day event monitor. Event monitors are medical devices that record the heart's electrical activity. Doctors most often [place] these monitors to diagnose arrhythmias. Arrhythmias are problems with the speed or rhythm of the heartbeat. The monitor is a small, portable device. You can wear one while you do your normal daily activities. This is usually used to diagnose what is causing palpitations/syncope (passing out).    Follow-Up: Keep August appointment  Any Other Special Instructions Will Be Listed Below (If Applicable).

## 2019-04-02 ENCOUNTER — Ambulatory Visit (INDEPENDENT_AMBULATORY_CARE_PROVIDER_SITE_OTHER): Payer: BLUE CROSS/BLUE SHIELD

## 2019-04-02 DIAGNOSIS — R002 Palpitations: Secondary | ICD-10-CM | POA: Diagnosis not present

## 2019-04-24 DIAGNOSIS — F341 Dysthymic disorder: Secondary | ICD-10-CM | POA: Diagnosis not present

## 2019-04-29 ENCOUNTER — Telehealth (HOSPITAL_COMMUNITY): Payer: Self-pay | Admitting: *Deleted

## 2019-04-29 NOTE — Telephone Encounter (Signed)
Left Detailed Message Per DPR  Including the Following:    COVID-19 Pre-Screening Questions:  . Do you currently have a fever?  (yes = cancel and refer to pcp for e-visit) . Have you recently travelled on a cruise, internationally, or to [place], [place], [place], [place], [place], or [place], [place] [employer]) ?  (yes = cancel, stay home, monitor symptoms, and contact pcp or initiate e-visit if symptoms develop) . Have you been in contact with someone that is currently pending confirmation of Covid19 testing or has been confirmed to have the Damar virus?   (yes = cancel, stay home, away from tested individual, monitor symptoms, and contact pcp or initiate e-visit if symptoms develop) . Are you currently experiencing fatigue or cough?  (yes = pt should be prepared to have a mask placed at the time of their visit).   . Reiterated no additional visitors. Eartha Inch no earlier than 15 minutes before appointment time. . Please bring own mask.  Junella Domke

## 2019-04-30 ENCOUNTER — Ambulatory Visit (HOSPITAL_COMMUNITY): Payer: BLUE CROSS/BLUE SHIELD | Attending: Cardiovascular Disease

## 2019-04-30 ENCOUNTER — Other Ambulatory Visit: Payer: Self-pay

## 2019-04-30 DIAGNOSIS — R002 Palpitations: Secondary | ICD-10-CM | POA: Insufficient documentation

## 2019-04-30 DIAGNOSIS — I340 Nonrheumatic mitral (valve) insufficiency: Secondary | ICD-10-CM | POA: Diagnosis not present

## 2019-05-22 ENCOUNTER — Ambulatory Visit: Payer: BLUE CROSS/BLUE SHIELD

## 2019-06-20 DIAGNOSIS — F341 Dysthymic disorder: Secondary | ICD-10-CM | POA: Diagnosis not present

## 2019-07-05 ENCOUNTER — Other Ambulatory Visit: Payer: Self-pay

## 2019-07-05 ENCOUNTER — Ambulatory Visit
Admission: RE | Admit: 2019-07-05 | Discharge: 2019-07-05 | Disposition: A | Discharge status: Home / Self Care | Payer: BC Managed Care – PPO | Source: Ambulatory Visit | Attending: Family Medicine | Admitting: Family Medicine

## 2019-07-05 DIAGNOSIS — Z1231 Encounter for screening mammogram for malignant neoplasm of breast: Secondary | ICD-10-CM

## 2019-07-16 NOTE — Progress Notes (Signed)
Cardiology Office Note:    Date:  07/17/2019   ID:  Christine Bell, DOB [birth year], MRN [id number]  PCP:  Cari Caraway, MD  Cardiologist:  Sinclair Grooms, MD   Referring MD: Cari Caraway, MD   Chief Complaint  Patient presents with  . Cardiac Valve Problem    Mitral valve prolapse    History of Present Illness:    Christine Bell is a 53 y.o. female with a hx of family history of mitral valve prolapse as well as a personal history of prolapse presenting with increased dyspnea. Now with c/o palpitations.  Christine Bell is doing well.  She has had palpitations.  These seem to correlate with isolated PVCs.  She has mitral valve prolapse.  No significant mitral regurgitation.  She has occasional dyspnea but is not related to mitral valve disease.  We discussed this in some detail.  She has never had syncope.  She denies orthopnea.  There is no peripheral edema.  No history of atrial fibrillation.  We discussed indications for mitral valve surgery.  A must is that there would need to be significant mitral valve regurgitation which she did not have.  Other indicators are development of atrial fibrillation in the presence of significant mitral regurgitation, LV cavity enlargement, decrease EF, etc.   Past Medical History:  Diagnosis Date  . Mitral valve prolapse     Past Surgical History:  Procedure Laterality Date  . TUBAL LIGATION  2002    Current Medications: Current Meds  Medication Sig  . FLUoxetine (PROZAC) 20 MG capsule Take 40 mg by mouth daily.   . IBUPROFEN PO Take 600 mg by mouth 4 (four) times daily as needed. (PAIN)     Allergies:   Bupropion   Social History   Socioeconomic History  . Marital status: Married    Spouse name: Not on file  . Number of children: Not on file  . Years of education: Not on file  . Highest education level: Not on file  Occupational History  . Not on file  Social Needs  . Financial resource strain: Not on file  . Food insecurity     Worry: Not on file    Inability: Not on file  . Transportation needs    Medical: Not on file    Non-medical: Not on file  Tobacco Use  . Smoking status: Never Smoker  . Smokeless tobacco: Never Used  Substance and Sexual Activity  . Alcohol use: Not on file  . Drug use: Not on file  . Sexual activity: Not on file  Lifestyle  . Physical activity    Days per week: Not on file    Minutes per session: Not on file  . Stress: Not on file  Relationships  . Social Herbalist on phone: Not on file    Gets together: Not on file    Attends religious service: Not on file    Active member of club or organization: Not on file    Attends meetings of clubs or organizations: Not on file    Relationship status: Not on file  Other Topics Concern  . Not on file  Social History Narrative  . Not on file     Family History: The patient's family history includes Arthritis in her mother; Breast cancer in her mother; Gout in her brother; Healthy in her brother, sister, and sister; Hyperlipidemia in her mother; Other in her father.  ROS:   Please see  the history of present illness.    Some anxiety concerning her health.  All other systems reviewed and are negative.  EKGs/Labs/Other Studies Reviewed:    The following studies were reviewed today:  Cardiac event monitor performed 04/30/2019: Study Highlights   NSR  Multifocal PVC's associated with lightheadedness     ECHOCARDIOGRAM 6/20: IMPRESSIONS    1. The left ventricle has normal systolic function, with an ejection fraction of 55-60%. The cavity size was normal. Left ventricular diastolic parameters were normal. Indeterminate filling pressures No evidence of left ventricular regional wall motion  abnormalities.  2. The right ventricle has normal systolic function. The cavity was normal. There is no increase in right ventricular wall thickness. Right ventricular systolic pressure is normal with an estimated pressure of  22.5 mmHg.  3. Mild mitral valve prolapse.  4. The mitral valve is myxomatous. Moderate thickening of the mitral valve leaflet.  EKG:  EKG normal sinus rhythm, incomplete right bundle, poor R wave progression.  When compared to the prior tracing performed  Recent Labs: No results found for requested labs within last 8760 hours.  Recent Lipid Panel No results found for: CHOL, TRIG, HDL, CHOLHDL, VLDL, LDLCALC, LDLDIRECT  Physical Exam:    VS:  BP 114/74   Pulse 62   Ht 5\' 9"  (1.753 m)   Wt 152 lb 12.8 oz (69.3 kg)   LMP 07/02/2019   SpO2 97%   BMI 22.56 kg/m     Wt Readings from Last 3 Encounters:  07/17/19 152 lb 12.8 oz (69.3 kg)  03/27/19 150 lb (68 kg)  12/13/16 146 lb 3.2 oz (66.3 kg)     GEN: Slender and healthy-appearing. No acute distress HEENT: Normal NECK: No JVD. LYMPHATICS: No lymphadenopathy CARDIAC: Mid to late systolic click is heard.  The click occurs earlier with standing.  Trivial mitral regurgitation murmur is heard.  RRR without diastolic murmur, gallop, or edema. VASCULAR:  Normal Pulses. No bruits. RESPIRATORY:  Clear to auscultation without rales, wheezing or rhonchi  ABDOMEN: Soft, non-tender, non-distended, No pulsatile mass, MUSCULOSKELETAL: No deformity  SKIN: Warm and dry NEUROLOGIC:  Alert and oriented x 3 PSYCHIATRIC:  Normal affect   ASSESSMENT:    1. Nonrheumatic mitral valve regurgitation   2. 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 RBBB   5. Educated About Covid-19 Virus Infection    PLAN:    In order of problems listed above:  1. Mitral valve prolapse without significant mitral regurgitation.  LV function and size is normal. 2. Isolated PVCs were noted on monitoring.  No PSVT or A. fib noted. 3. Not addressed and not sure if this is an actual problem. 4. She has incomplete right bundle branch block on today's EKG. 5. Social distancing, handwashing, and mask wearing is recommended.   Clinical observation.  1 year follow-up.  Consider echo pulmonary return.   Medication Adjustments/Labs and Tests Ordered: Current medicines are reviewed at length with the patient today.  Concerns regarding medicines are outlined above.  Orders Placed This Encounter  Procedures  . EKG 12-Lead   No orders of the defined types were placed in this encounter.   Patient Instructions  Medication Instructions:  Your physician recommends that you continue on your current medications as directed. Please refer to the Current Medication list given to you today.  If you need a refill on your cardiac medications before your next appointment, please call your pharmacy.   Lab work: None If  you have labs (blood work) drawn today and your tests are completely normal, you will receive your results only by: Marland Kitchen MyChart Message (if you have MyChart) OR . A paper copy in the mail If you have any lab test that is abnormal or we need to change your treatment, we will call you to review the results.  Testing/Procedures: None  Follow-Up: At [facility], you and your health needs are our priority.  As part of our continuing mission to provide you with exceptional heart care, we have created designated Provider Care Teams.  These Care Teams include your primary Cardiologist (physician) and Advanced Practice Providers (APPs -  Physician Assistants and Nurse Practitioners) who all work together to provide you with the care you need, when you need it. You will need a follow up appointment in 12 months.  Please call our office 2 months in advance to schedule this appointment.  You may see Sinclair Grooms, MD or one of the following Advanced Practice Providers on your designated Care Team:   Truitt Merle, NP Cecilie Kicks, NP . Kathyrn Drown, NP  Any Other Special Instructions Will Be Listed Below (If Applicable).       Signed, Sinclair Grooms, MD  07/17/2019 5:26 PM    [place]

## 2019-07-17 ENCOUNTER — Ambulatory Visit: Payer: BLUE CROSS/BLUE SHIELD | Admitting: Interventional Cardiology

## 2019-07-17 ENCOUNTER — Other Ambulatory Visit: Payer: Self-pay

## 2019-07-17 ENCOUNTER — Encounter: Payer: Self-pay | Admitting: Interventional Cardiology

## 2019-07-17 VITALS — BP 114/74 | HR 62 | Ht 69.0 in | Wt 152.8 lb

## 2019-07-17 DIAGNOSIS — I4510 Unspecified right bundle-branch block: Secondary | ICD-10-CM | POA: Diagnosis not present

## 2019-07-17 DIAGNOSIS — Z7189 Other specified counseling: Secondary | ICD-10-CM

## 2019-07-17 DIAGNOSIS — I340 Nonrheumatic mitral (valve) insufficiency: Secondary | ICD-10-CM

## 2019-07-17 DIAGNOSIS — R002 Palpitations: Secondary | ICD-10-CM | POA: Diagnosis not present

## 2019-07-17 DIAGNOSIS — F064 Anxiety disorder due to known physiological condition: Secondary | ICD-10-CM | POA: Diagnosis not present

## 2019-07-17 NOTE — Patient Instructions (Signed)
Medication Instructions:  Your physician recommends that you continue on your current medications as directed. Please refer to the Current Medication list given to you today.  If you need a refill on your cardiac medications before your next appointment, please call your pharmacy.   Lab work: None If you have labs (blood work) drawn today and your tests are completely normal, you will receive your results only by: . MyChart Message (if you have MyChart) OR . A paper copy in the mail If you have any lab test that is abnormal or we need to change your treatment, we will call you to review the results.  Testing/Procedures: None  Follow-Up: At [facility], you and your health needs are our priority.  As part of our continuing mission to provide you with exceptional heart care, we have created designated Provider Care Teams.  These Care Teams include your primary Cardiologist (physician) and Advanced Practice Providers (APPs -  Physician Assistants and Nurse Practitioners) who all work together to provide you with the care you need, when you need it. You will need a follow up appointment in 12 months.  Please call our office 2 months in advance to schedule this appointment.  You may see Henry W Smith III, MD or one of the following Advanced Practice Providers on your designated Care Team:   Lori Gerhardt, NP Manasi Ingold, NP . Jill McDaniel, NP  Any Other Special Instructions Will Be Listed Below (If Applicable).    

## 2019-10-03 DIAGNOSIS — Z0000 Encounter for general adult medical examination without abnormal findings: Secondary | ICD-10-CM | POA: Diagnosis not present

## 2019-10-03 DIAGNOSIS — F3342 Major depressive disorder, recurrent, in full remission: Secondary | ICD-10-CM | POA: Diagnosis not present

## 2019-10-15 DIAGNOSIS — Z85828 Personal history of other malignant neoplasm of skin: Secondary | ICD-10-CM | POA: Diagnosis not present

## 2019-10-15 DIAGNOSIS — L821 Other seborrheic keratosis: Secondary | ICD-10-CM | POA: Diagnosis not present

## 2019-10-15 DIAGNOSIS — D692 Other nonthrombocytopenic purpura: Secondary | ICD-10-CM | POA: Diagnosis not present

## 2019-10-15 DIAGNOSIS — D1801 Hemangioma of skin and subcutaneous tissue: Secondary | ICD-10-CM | POA: Diagnosis not present

## 2020-07-29 ENCOUNTER — Other Ambulatory Visit: Payer: Self-pay | Admitting: Family Medicine

## 2020-07-29 DIAGNOSIS — Z1231 Encounter for screening mammogram for malignant neoplasm of breast: Secondary | ICD-10-CM

## 2020-08-13 ENCOUNTER — Ambulatory Visit
Admission: RE | Admit: 2020-08-13 | Discharge: 2020-08-13 | Disposition: A | Discharge status: Home / Self Care | Payer: BC Managed Care – PPO | Source: Ambulatory Visit | Attending: Family Medicine | Admitting: Family Medicine

## 2020-08-13 ENCOUNTER — Other Ambulatory Visit: Payer: Self-pay

## 2020-08-13 DIAGNOSIS — Z1231 Encounter for screening mammogram for malignant neoplasm of breast: Secondary | ICD-10-CM | POA: Diagnosis not present

## 2020-08-26 ENCOUNTER — Other Ambulatory Visit: Payer: Self-pay

## 2020-08-26 ENCOUNTER — Ambulatory Visit (INDEPENDENT_AMBULATORY_CARE_PROVIDER_SITE_OTHER): Payer: Self-pay

## 2020-08-26 DIAGNOSIS — Z23 Encounter for immunization: Secondary | ICD-10-CM

## 2020-10-30 DIAGNOSIS — L821 Other seborrheic keratosis: Secondary | ICD-10-CM | POA: Diagnosis not present

## 2020-10-30 DIAGNOSIS — B078 Other viral warts: Secondary | ICD-10-CM | POA: Diagnosis not present

## 2020-10-30 DIAGNOSIS — D2272 Melanocytic nevi of left lower limb, including hip: Secondary | ICD-10-CM | POA: Diagnosis not present

## 2020-10-30 DIAGNOSIS — Z85828 Personal history of other malignant neoplasm of skin: Secondary | ICD-10-CM | POA: Diagnosis not present

## 2020-10-30 DIAGNOSIS — L918 Other hypertrophic disorders of the skin: Secondary | ICD-10-CM | POA: Diagnosis not present

## 2020-12-15 DIAGNOSIS — Z86010 Personal history of colonic polyps: Secondary | ICD-10-CM | POA: Diagnosis not present

## 2020-12-15 DIAGNOSIS — Z1211 Encounter for screening for malignant neoplasm of colon: Secondary | ICD-10-CM | POA: Diagnosis not present

## 2020-12-18 DIAGNOSIS — F3342 Major depressive disorder, recurrent, in full remission: Secondary | ICD-10-CM | POA: Diagnosis not present

## 2020-12-18 DIAGNOSIS — Z23 Encounter for immunization: Secondary | ICD-10-CM | POA: Diagnosis not present

## 2020-12-18 DIAGNOSIS — Z0000 Encounter for general adult medical examination without abnormal findings: Secondary | ICD-10-CM | POA: Diagnosis not present

## 2021-02-12 DIAGNOSIS — D120 Benign neoplasm of cecum: Secondary | ICD-10-CM | POA: Diagnosis not present

## 2021-02-12 DIAGNOSIS — Z1211 Encounter for screening for malignant neoplasm of colon: Secondary | ICD-10-CM | POA: Diagnosis not present

## 2021-02-12 DIAGNOSIS — K635 Polyp of colon: Secondary | ICD-10-CM | POA: Diagnosis not present

## 2021-04-20 ENCOUNTER — Other Ambulatory Visit: Payer: Self-pay

## 2021-04-20 ENCOUNTER — Ambulatory Visit: Payer: Self-pay

## 2021-04-20 DIAGNOSIS — Z23 Encounter for immunization: Secondary | ICD-10-CM

## 2021-07-14 ENCOUNTER — Other Ambulatory Visit: Payer: Self-pay | Admitting: Family Medicine

## 2021-07-14 DIAGNOSIS — Z1231 Encounter for screening mammogram for malignant neoplasm of breast: Secondary | ICD-10-CM

## 2021-08-18 ENCOUNTER — Other Ambulatory Visit: Payer: Self-pay

## 2021-08-18 ENCOUNTER — Ambulatory Visit
Admission: RE | Admit: 2021-08-18 | Discharge: 2021-08-18 | Disposition: A | Discharge status: Home / Self Care | Payer: BC Managed Care – PPO | Source: Ambulatory Visit | Attending: Family Medicine | Admitting: Family Medicine

## 2021-08-18 DIAGNOSIS — Z1231 Encounter for screening mammogram for malignant neoplasm of breast: Secondary | ICD-10-CM | POA: Diagnosis not present

## 2021-12-07 DIAGNOSIS — L918 Other hypertrophic disorders of the skin: Secondary | ICD-10-CM | POA: Diagnosis not present

## 2021-12-07 DIAGNOSIS — L814 Other melanin hyperpigmentation: Secondary | ICD-10-CM | POA: Diagnosis not present

## 2021-12-07 DIAGNOSIS — Z85828 Personal history of other malignant neoplasm of skin: Secondary | ICD-10-CM | POA: Diagnosis not present

## 2021-12-07 DIAGNOSIS — B078 Other viral warts: Secondary | ICD-10-CM | POA: Diagnosis not present

## 2021-12-07 DIAGNOSIS — D2261 Melanocytic nevi of right upper limb, including shoulder: Secondary | ICD-10-CM | POA: Diagnosis not present

## 2021-12-24 DIAGNOSIS — Z0000 Encounter for general adult medical examination without abnormal findings: Secondary | ICD-10-CM | POA: Diagnosis not present

## 2021-12-24 DIAGNOSIS — Z131 Encounter for screening for diabetes mellitus: Secondary | ICD-10-CM | POA: Diagnosis not present

## 2021-12-24 DIAGNOSIS — F3342 Major depressive disorder, recurrent, in full remission: Secondary | ICD-10-CM | POA: Diagnosis not present

## 2021-12-24 DIAGNOSIS — Z13220 Encounter for screening for lipoid disorders: Secondary | ICD-10-CM | POA: Diagnosis not present

## 2022-06-19 ENCOUNTER — Telehealth: Payer: Self-pay | Admitting: Pediatrics

## 2022-06-19 NOTE — Telephone Encounter (Signed)
I attempted 3 separate phone calls on 3 consecutive days to inform patient and/or patient's caregiver that they received a COVID-19 vaccination at [place] ([facility]) that was determined to be past it's effective date based on the length of time it was out of the frozen state to the date it was administered. Contact with the patient/caregiver was unsuccessful.  As a result a letter will be sent to inform the patient of their ability to become revaccinated free of charge if they so desire.

## 2022-07-15 ENCOUNTER — Other Ambulatory Visit: Payer: Self-pay | Admitting: Family Medicine

## 2022-07-15 DIAGNOSIS — Z1231 Encounter for screening mammogram for malignant neoplasm of breast: Secondary | ICD-10-CM

## 2022-08-23 ENCOUNTER — Other Ambulatory Visit: Payer: Self-pay | Admitting: Family Medicine

## 2022-08-23 DIAGNOSIS — R6889 Other general symptoms and signs: Secondary | ICD-10-CM | POA: Diagnosis not present

## 2022-08-23 DIAGNOSIS — E079 Disorder of thyroid, unspecified: Secondary | ICD-10-CM | POA: Diagnosis not present

## 2022-08-23 DIAGNOSIS — L659 Nonscarring hair loss, unspecified: Secondary | ICD-10-CM | POA: Diagnosis not present

## 2022-08-24 ENCOUNTER — Ambulatory Visit
Admission: RE | Admit: 2022-08-24 | Discharge: 2022-08-24 | Disposition: A | Discharge status: Home / Self Care | Payer: BC Managed Care – PPO | Source: Ambulatory Visit | Attending: Family Medicine | Admitting: Family Medicine

## 2022-08-24 DIAGNOSIS — Z1231 Encounter for screening mammogram for malignant neoplasm of breast: Secondary | ICD-10-CM | POA: Diagnosis not present

## 2022-08-29 ENCOUNTER — Ambulatory Visit
Admission: RE | Admit: 2022-08-29 | Discharge: 2022-08-29 | Disposition: A | Discharge status: Home / Self Care | Payer: BC Managed Care – PPO | Source: Ambulatory Visit | Attending: Family Medicine | Admitting: Family Medicine

## 2022-08-29 DIAGNOSIS — E041 Nontoxic single thyroid nodule: Secondary | ICD-10-CM | POA: Diagnosis not present

## 2022-08-29 DIAGNOSIS — E079 Disorder of thyroid, unspecified: Secondary | ICD-10-CM

## 2022-10-17 NOTE — Progress Notes (Unsigned)
Cardiology Office Note:    Date:  10/18/2022   ID:  Christine Bell, DOB 11/19/1966, MRN [id number]  PCP:  Cari Caraway, MD  Cardiologist:  Sinclair Grooms, MD   Referring MD: Cari Caraway, MD   Chief Complaint  Patient presents with   Cardiac Valve Problem    Mitral valve prolapse    History of Present Illness:    Christine Bell is a 56 y.o. female with a family history of mitral valve prolapse and MVP.    [name] is doing well and has no cardiac symptoms.  She specifically denies palpitations, orthopnea, dyspnea on exertion, syncope, chest pain, and peripheral edema.  Past Medical History:  Diagnosis Date   Mitral valve prolapse     Past Surgical History:  Procedure Laterality Date   TUBAL LIGATION  2002    Current Medications: Current Meds  Medication Sig   cetirizine (ZYRTEC) 10 MG chewable tablet Chew 10 mg by mouth daily.   FLUoxetine (PROZAC) 20 MG capsule Take 20 mg by mouth daily.   IBUPROFEN PO Take 600 mg by mouth 4 (four) times daily as needed. (PAIN)     Allergies:   Bupropion   Social History   Socioeconomic History   Marital status: Married    Spouse name: Not on file   Number of children: Not on file   Years of education: Not on file   Highest education level: Not on file  Occupational History   Not on file  Tobacco Use   Smoking status: Never   Smokeless tobacco: Never  Substance and Sexual Activity   Alcohol use: Not on file   Drug use: Not on file   Sexual activity: Not on file  Other Topics Concern   Not on file  Social History Narrative   Not on file   Social Determinants of Health   Financial Resource Strain: Not on file  Food Insecurity: Not on file  Transportation Needs: Not on file  Physical Activity: Not on file  Stress: Not on file  Social Connections: Not on file     Family History: The patient's family history includes Arthritis in her mother; Breast cancer in her mother; Gout in her brother; Healthy in her  brother, sister, and sister; Hyperlipidemia in her mother; Other in her father.  ROS:   Please see the history of present illness.    Been having some thyroid trouble but otherwise no issue.  All other systems reviewed and are negative.  EKGs/Labs/Other Studies Reviewed:    The following studies were reviewed today:  2 D Doppler ECHOCARDIOGRAM 04/30/2019: IMPRESSIONS   1. The left ventricle has normal systolic function, with an ejection  fraction of 55-60%. The cavity size was normal. Left ventricular diastolic  parameters were normal. Indeterminate filling pressures No evidence of  left ventricular regional wall motion  abnormalities.   2. The right ventricle has normal systolic function. The cavity was  normal. There is no increase in right ventricular wall thickness. Right  ventricular systolic pressure is normal with an estimated pressure of 22.5  mmHg.   3. Mild mitral valve prolapse.   4. The mitral valve is myxomatous. Moderate thickening of the mitral  valve leaflet.     EKG:  EKG normal sinus rhythm, heart rate 61, normal study.  Study was performed on 10/18/2022.  Recent Labs: No results found for requested labs within last 365 days.  Recent Lipid Panel No results found for: "CHOL", "TRIG", "HDL", "CHOLHDL", "  VLDL", "LDLCALC", "LDLDIRECT"  Physical Exam:    VS:  BP 102/74   Pulse 61   Ht '5\' 9"'$  (1.753 m)   Wt 151 lb 13.6 oz (68.9 kg)   SpO2 99%   BMI 22.42 kg/m     Wt Readings from Last 3 Encounters:  10/18/22 151 lb 13.6 oz (68.9 kg)  07/17/19 152 lb 12.8 oz (69.3 kg)  03/27/19 150 lb (68 kg)     GEN: Slender. No acute distress HEENT: Normal NECK: No JVD. LYMPHATICS: No lymphadenopathy CARDIAC: 2/6 left lower sternal, apical, and left axillary late systolic murmur.  Multiple midsystolic clicks are heard.  The clicks and the murmur becomes somewhat louder with standing.  RRR no gallop, or edema. VASCULAR:  Normal Pulses. No bruits. RESPIRATORY:  Clear to  auscultation without rales, wheezing or rhonchi  ABDOMEN: Soft, non-tender, non-distended, No pulsatile mass, MUSCULOSKELETAL: No deformity  SKIN: Warm and dry NEUROLOGIC:  Alert and oriented x 3 PSYCHIATRIC:  Normal affect   ASSESSMENT:    1. Nonrheumatic mitral valve regurgitation   2. Mitral valve prolapse    PLAN:    In order of problems listed above:  Clinical exam is consistent with mitral valve prolapse.  No gallops are heard and there is no evidence of volume overload.  Plan 2D Doppler echocardiogram to compare with findings from 2000.  Follow-up should be with Dr. Dani Gobble Croitoru 1 year.   Medication Adjustments/Labs and Tests Ordered: Current medicines are reviewed at length with the patient today.  Concerns regarding medicines are outlined above.  No orders of the defined types were placed in this encounter.  No orders of the defined types were placed in this encounter.   Patient Instructions  Medication Instructions:  Your physician recommends that you continue on your current medications as directed. Please refer to the Current Medication list given to you today.  *If you need a refill on your cardiac medications before your next appointment, please call your pharmacy*  Testing/Procedures: Your physician has requested that you have an echocardiogram. Echocardiography is a painless test that uses sound waves to create images of your heart. It provides your doctor with information about the size and shape of your heart and how well your heart's chambers and valves are working. This procedure takes approximately one hour. There are no restrictions for this procedure. Please do NOT wear cologne, perfume, aftershave, or lotions (deodorant is allowed). Please arrive 15 minutes prior to your appointment time.  Follow-Up: At [facility], you and your health needs are our priority.  As part of our continuing mission to provide you with exceptional heart care, we  have created designated Provider Care Teams.  These Care Teams include your primary Cardiologist (physician) and Advanced Practice Providers (APPs -  Physician Assistants and Nurse Practitioners) who all work together to provide you with the care you need, when you need it.  Your next appointment:   1 year(s)  The format for your next appointment:   In Person  Provider:   Sanda Klein, MD  Important Information About Sugar         Signed, Sinclair Grooms, MD  10/18/2022 11:24 AM    [place]

## 2022-10-18 ENCOUNTER — Ambulatory Visit: Payer: BC Managed Care – PPO | Attending: Interventional Cardiology | Admitting: Interventional Cardiology

## 2022-10-18 ENCOUNTER — Encounter: Payer: Self-pay | Admitting: Interventional Cardiology

## 2022-10-18 VITALS — BP 102/74 | HR 61 | Ht 69.0 in | Wt 151.8 lb

## 2022-10-18 DIAGNOSIS — I341 Nonrheumatic mitral (valve) prolapse: Secondary | ICD-10-CM | POA: Diagnosis not present

## 2022-10-18 DIAGNOSIS — I340 Nonrheumatic mitral (valve) insufficiency: Secondary | ICD-10-CM | POA: Diagnosis not present

## 2022-10-18 NOTE — Patient Instructions (Addendum)
Medication Instructions:  Your physician recommends that you continue on your current medications as directed. Please refer to the Current Medication list given to you today.  *If you need a refill on your cardiac medications before your next appointment, please call your pharmacy*  Testing/Procedures: Your physician has requested that you have an echocardiogram. Echocardiography is a painless test that uses sound waves to create images of your heart. It provides your doctor with information about the size and shape of your heart and how well your heart's chambers and valves are working. This procedure takes approximately one hour. There are no restrictions for this procedure. Please do NOT wear cologne, perfume, aftershave, or lotions (deodorant is allowed). Please arrive 15 minutes prior to your appointment time.  Follow-Up: At [facility], you and your health needs are our priority.  As part of our continuing mission to provide you with exceptional heart care, we have created designated Provider Care Teams.  These Care Teams include your primary Cardiologist (physician) and Advanced Practice Providers (APPs -  Physician Assistants and Nurse Practitioners) who all work together to provide you with the care you need, when you need it.  Your next appointment:   1 year(s)  The format for your next appointment:   In Person  Provider:   Sanda Klein, MD  Important Information About Sugar

## 2022-11-04 ENCOUNTER — Ambulatory Visit (HOSPITAL_COMMUNITY): Payer: BC Managed Care – PPO | Attending: Interventional Cardiology

## 2022-11-04 DIAGNOSIS — I340 Nonrheumatic mitral (valve) insufficiency: Secondary | ICD-10-CM | POA: Insufficient documentation

## 2022-11-04 DIAGNOSIS — I341 Nonrheumatic mitral (valve) prolapse: Secondary | ICD-10-CM | POA: Diagnosis not present

## 2022-11-04 LAB — ECHOCARDIOGRAM COMPLETE
Area-P 1/2: 4.63 cm2
S' Lateral: 2.7 cm

## 2023-01-04 DIAGNOSIS — F3342 Major depressive disorder, recurrent, in full remission: Secondary | ICD-10-CM | POA: Diagnosis not present

## 2023-01-04 DIAGNOSIS — Z0000 Encounter for general adult medical examination without abnormal findings: Secondary | ICD-10-CM | POA: Diagnosis not present

## 2023-01-04 DIAGNOSIS — Z124 Encounter for screening for malignant neoplasm of cervix: Secondary | ICD-10-CM | POA: Diagnosis not present

## 2023-01-04 DIAGNOSIS — E041 Nontoxic single thyroid nodule: Secondary | ICD-10-CM | POA: Diagnosis not present

## 2023-02-02 DIAGNOSIS — D2271 Melanocytic nevi of right lower limb, including hip: Secondary | ICD-10-CM | POA: Diagnosis not present

## 2023-02-02 DIAGNOSIS — L821 Other seborrheic keratosis: Secondary | ICD-10-CM | POA: Diagnosis not present

## 2023-02-02 DIAGNOSIS — D225 Melanocytic nevi of trunk: Secondary | ICD-10-CM | POA: Diagnosis not present

## 2023-02-02 DIAGNOSIS — Z85828 Personal history of other malignant neoplasm of skin: Secondary | ICD-10-CM | POA: Diagnosis not present

## 2023-04-12 IMAGING — MG MM DIGITAL SCREENING BILAT W/ TOMO AND CAD
8 series · 8 of 24 positions shown · non-contrast
Comparison: Previous exam(s).

CLINICAL DATA: Screening.

EXAM:
DIGITAL SCREENING BILATERAL MAMMOGRAM WITH TOMOSYNTHESIS AND CAD
TECHNIQUE: Bilateral screening digital craniocaudal and mediolateral oblique
mammograms were obtained. Bilateral screening digital breast
tomosynthesis was performed. The images were evaluated with
computer-aided detection.

[R CC synth-2D]
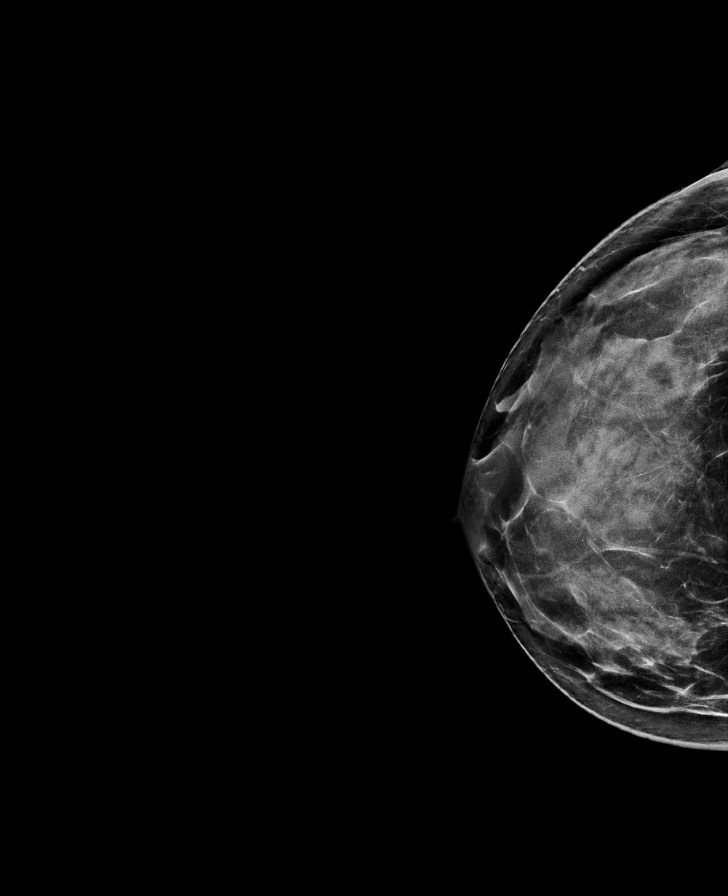

[R MLO synth-2D]
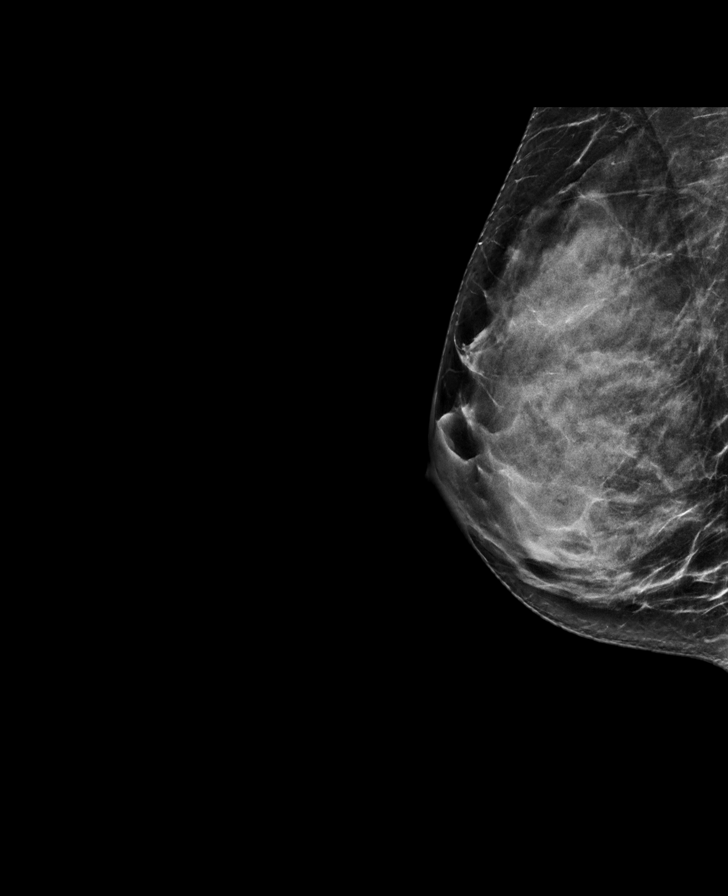

[L MLO synth-2D]
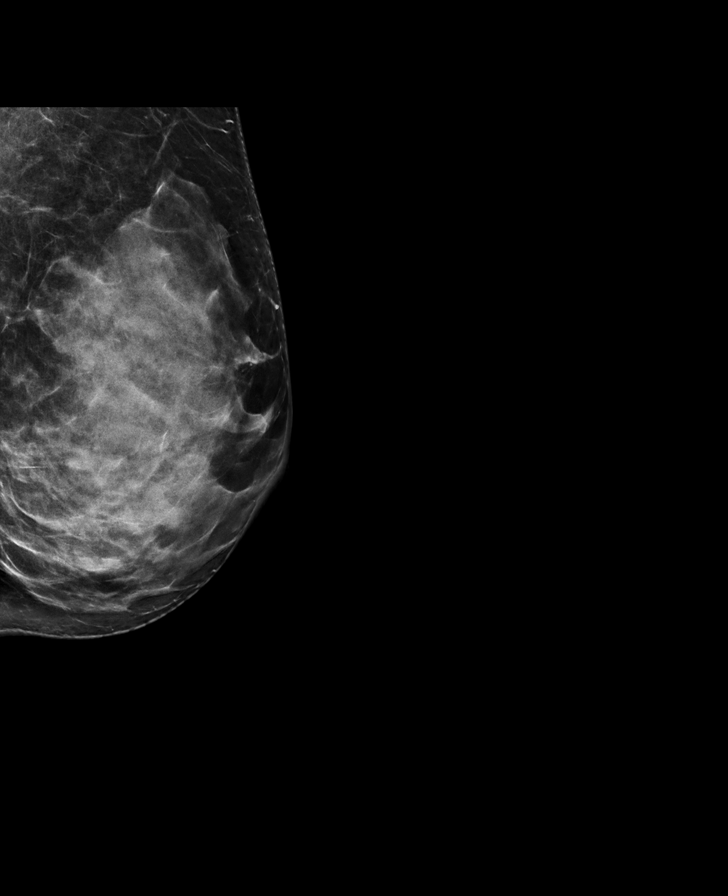

[L CC synth-2D]
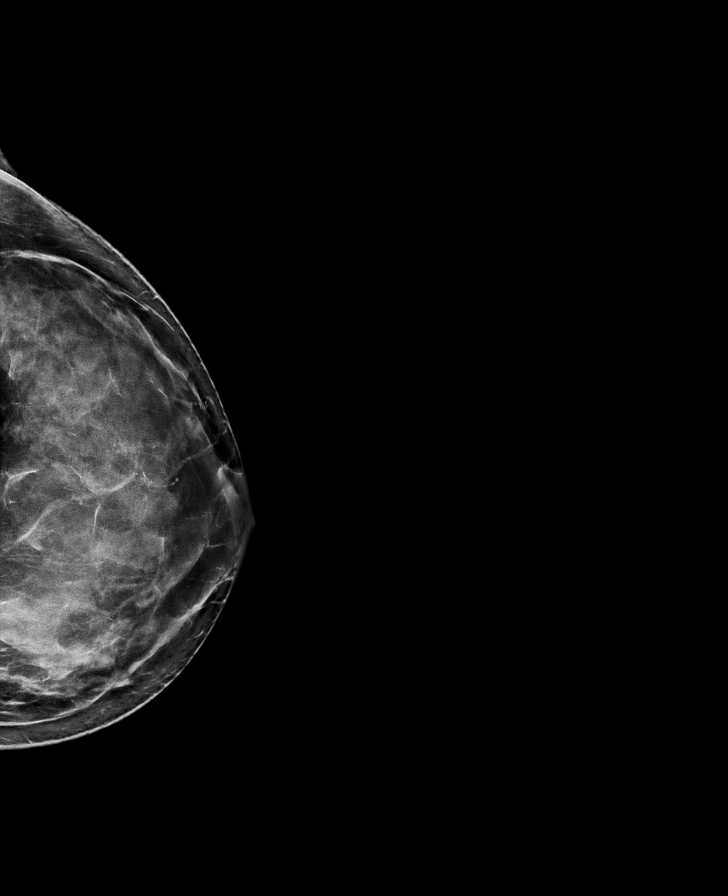

[R MLO tomo · tomo slice 35/68.0]
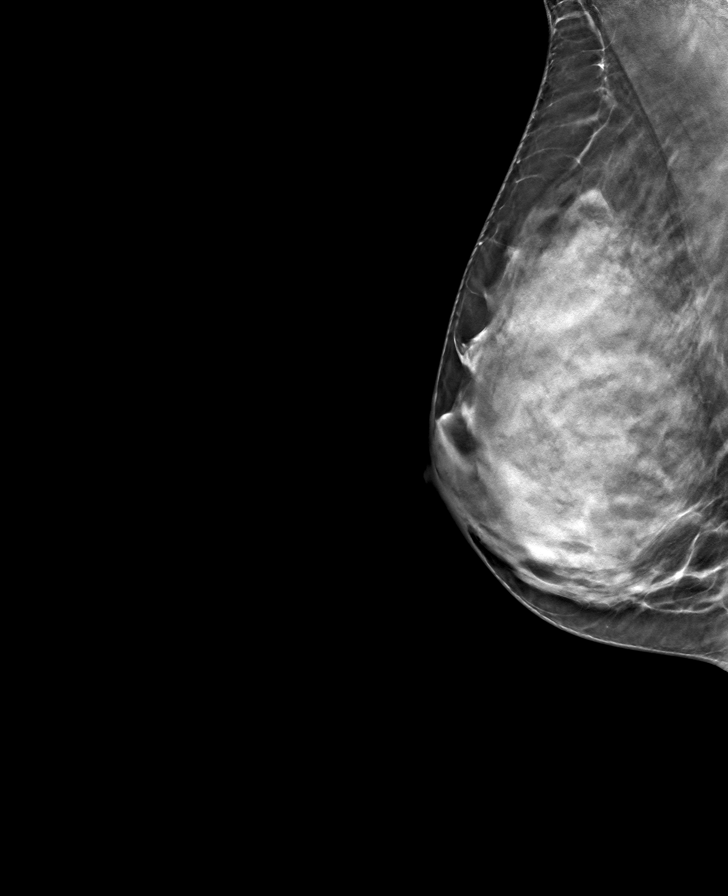

[L CC tomo · tomo slice 39/78.0]
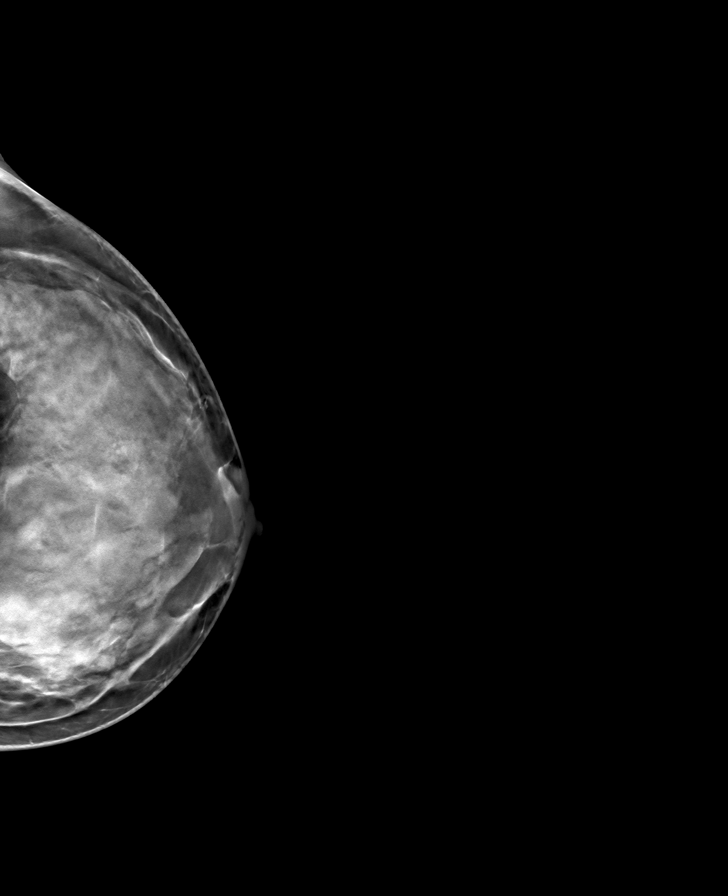

[L MLO tomo · tomo slice 37/72.0]
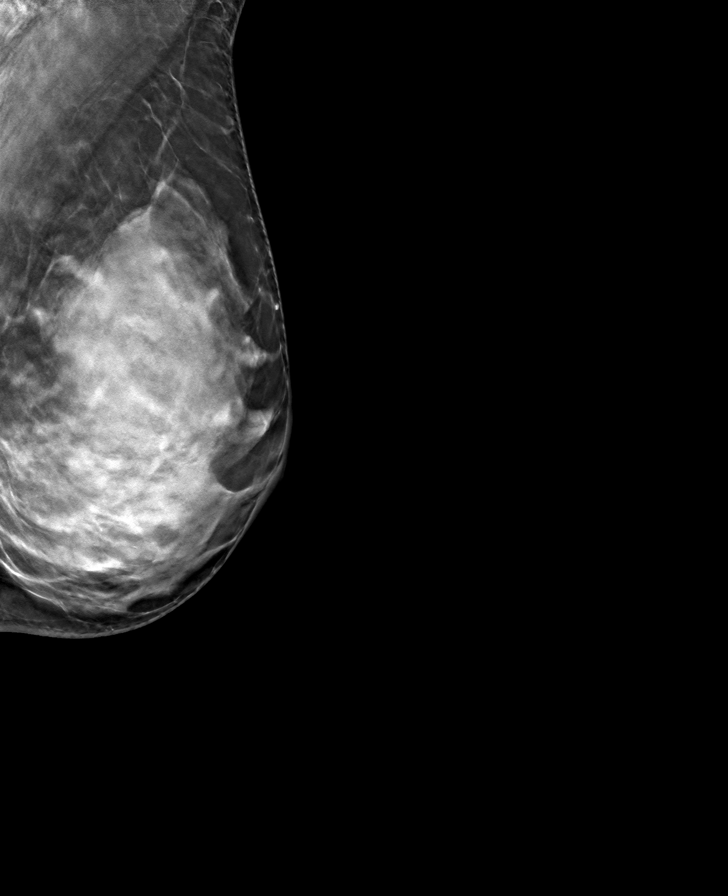

[R CC tomo · tomo slice 37/74.0]
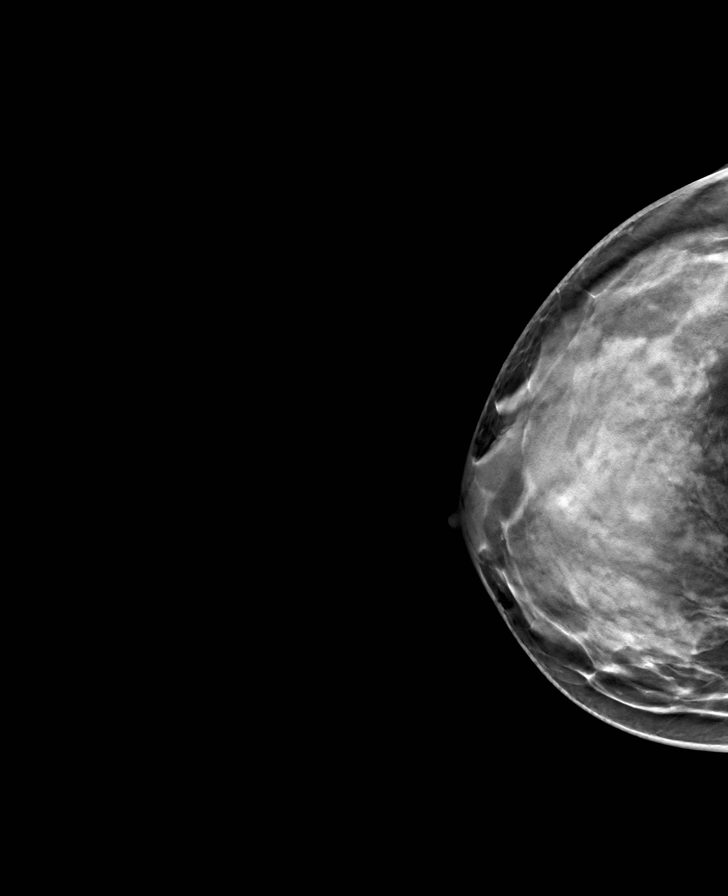

[8 of 24 positions shown; findings below may reference images not displayed]

ACR Breast Density Category d: The breast tissue is extremely dense,
which lowers the sensitivity of mammography
FINDINGS: There are no findings suspicious for malignancy.
IMPRESSION: No mammographic evidence of malignancy. A result letter of this
screening mammogram will be mailed directly to the patient.

RECOMMENDATION:
Screening mammogram in one year. (Code:TA-V-WV9)

BI-RADS CATEGORY  1: Negative.

## 2023-07-11 ENCOUNTER — Other Ambulatory Visit: Payer: Self-pay | Admitting: Family Medicine

## 2023-07-11 DIAGNOSIS — Z1231 Encounter for screening mammogram for malignant neoplasm of breast: Secondary | ICD-10-CM

## 2023-08-28 ENCOUNTER — Ambulatory Visit
Admission: RE | Admit: 2023-08-28 | Discharge: 2023-08-28 | Disposition: A | Discharge status: Home / Self Care | Payer: BC Managed Care – PPO | Source: Ambulatory Visit | Attending: Family Medicine | Admitting: Family Medicine

## 2023-08-28 DIAGNOSIS — Z1231 Encounter for screening mammogram for malignant neoplasm of breast: Secondary | ICD-10-CM

## 2023-09-01 ENCOUNTER — Other Ambulatory Visit: Payer: Self-pay | Admitting: Family Medicine

## 2023-09-01 DIAGNOSIS — E041 Nontoxic single thyroid nodule: Secondary | ICD-10-CM

## 2023-09-06 ENCOUNTER — Ambulatory Visit
Admission: RE | Admit: 2023-09-06 | Discharge: 2023-09-06 | Disposition: A | Discharge status: Home / Self Care | Payer: BC Managed Care – PPO | Source: Ambulatory Visit | Attending: Family Medicine | Admitting: Family Medicine

## 2023-09-06 DIAGNOSIS — E041 Nontoxic single thyroid nodule: Secondary | ICD-10-CM | POA: Diagnosis not present

## 2023-09-11 DIAGNOSIS — M9903 Segmental and somatic dysfunction of lumbar region: Secondary | ICD-10-CM | POA: Diagnosis not present

## 2023-09-11 DIAGNOSIS — M9906 Segmental and somatic dysfunction of lower extremity: Secondary | ICD-10-CM | POA: Diagnosis not present

## 2023-09-11 DIAGNOSIS — M722 Plantar fascial fibromatosis: Secondary | ICD-10-CM | POA: Diagnosis not present

## 2023-09-11 DIAGNOSIS — M5416 Radiculopathy, lumbar region: Secondary | ICD-10-CM | POA: Diagnosis not present

## 2023-09-15 DIAGNOSIS — M722 Plantar fascial fibromatosis: Secondary | ICD-10-CM | POA: Diagnosis not present

## 2023-09-15 DIAGNOSIS — M5416 Radiculopathy, lumbar region: Secondary | ICD-10-CM | POA: Diagnosis not present

## 2023-09-15 DIAGNOSIS — M9906 Segmental and somatic dysfunction of lower extremity: Secondary | ICD-10-CM | POA: Diagnosis not present

## 2023-09-15 DIAGNOSIS — M9903 Segmental and somatic dysfunction of lumbar region: Secondary | ICD-10-CM | POA: Diagnosis not present

## 2023-09-18 DIAGNOSIS — M9906 Segmental and somatic dysfunction of lower extremity: Secondary | ICD-10-CM | POA: Diagnosis not present

## 2023-09-18 DIAGNOSIS — M722 Plantar fascial fibromatosis: Secondary | ICD-10-CM | POA: Diagnosis not present

## 2023-09-18 DIAGNOSIS — M9903 Segmental and somatic dysfunction of lumbar region: Secondary | ICD-10-CM | POA: Diagnosis not present

## 2023-09-18 DIAGNOSIS — M5416 Radiculopathy, lumbar region: Secondary | ICD-10-CM | POA: Diagnosis not present

## 2023-09-20 DIAGNOSIS — M9903 Segmental and somatic dysfunction of lumbar region: Secondary | ICD-10-CM | POA: Diagnosis not present

## 2023-09-20 DIAGNOSIS — M722 Plantar fascial fibromatosis: Secondary | ICD-10-CM | POA: Diagnosis not present

## 2023-09-20 DIAGNOSIS — M5416 Radiculopathy, lumbar region: Secondary | ICD-10-CM | POA: Diagnosis not present

## 2023-09-20 DIAGNOSIS — M9906 Segmental and somatic dysfunction of lower extremity: Secondary | ICD-10-CM | POA: Diagnosis not present

## 2023-10-02 DIAGNOSIS — M9906 Segmental and somatic dysfunction of lower extremity: Secondary | ICD-10-CM | POA: Diagnosis not present

## 2023-10-02 DIAGNOSIS — M5416 Radiculopathy, lumbar region: Secondary | ICD-10-CM | POA: Diagnosis not present

## 2023-10-02 DIAGNOSIS — M722 Plantar fascial fibromatosis: Secondary | ICD-10-CM | POA: Diagnosis not present

## 2023-10-02 DIAGNOSIS — M9903 Segmental and somatic dysfunction of lumbar region: Secondary | ICD-10-CM | POA: Diagnosis not present

## 2023-10-16 DIAGNOSIS — M79671 Pain in right foot: Secondary | ICD-10-CM | POA: Diagnosis not present

## 2023-10-16 DIAGNOSIS — M7918 Myalgia, other site: Secondary | ICD-10-CM | POA: Diagnosis not present

## 2023-10-16 DIAGNOSIS — M545 Low back pain, unspecified: Secondary | ICD-10-CM | POA: Diagnosis not present

## 2023-10-16 DIAGNOSIS — M25671 Stiffness of right ankle, not elsewhere classified: Secondary | ICD-10-CM | POA: Diagnosis not present

## 2023-10-16 DIAGNOSIS — M25672 Stiffness of left ankle, not elsewhere classified: Secondary | ICD-10-CM | POA: Diagnosis not present

## 2023-10-16 DIAGNOSIS — M9904 Segmental and somatic dysfunction of sacral region: Secondary | ICD-10-CM | POA: Diagnosis not present

## 2023-10-16 DIAGNOSIS — M5416 Radiculopathy, lumbar region: Secondary | ICD-10-CM | POA: Diagnosis not present

## 2023-10-16 DIAGNOSIS — M79672 Pain in left foot: Secondary | ICD-10-CM | POA: Diagnosis not present

## 2023-10-16 DIAGNOSIS — M9906 Segmental and somatic dysfunction of lower extremity: Secondary | ICD-10-CM | POA: Diagnosis not present

## 2023-10-16 DIAGNOSIS — M9903 Segmental and somatic dysfunction of lumbar region: Secondary | ICD-10-CM | POA: Diagnosis not present

## 2023-10-16 DIAGNOSIS — M9905 Segmental and somatic dysfunction of pelvic region: Secondary | ICD-10-CM | POA: Diagnosis not present

## 2023-10-16 DIAGNOSIS — M722 Plantar fascial fibromatosis: Secondary | ICD-10-CM | POA: Diagnosis not present

## 2023-10-30 DIAGNOSIS — M9906 Segmental and somatic dysfunction of lower extremity: Secondary | ICD-10-CM | POA: Diagnosis not present

## 2023-10-30 DIAGNOSIS — M9903 Segmental and somatic dysfunction of lumbar region: Secondary | ICD-10-CM | POA: Diagnosis not present

## 2023-10-30 DIAGNOSIS — M5416 Radiculopathy, lumbar region: Secondary | ICD-10-CM | POA: Diagnosis not present

## 2023-10-30 DIAGNOSIS — M722 Plantar fascial fibromatosis: Secondary | ICD-10-CM | POA: Diagnosis not present

## 2023-11-20 DIAGNOSIS — M9906 Segmental and somatic dysfunction of lower extremity: Secondary | ICD-10-CM | POA: Diagnosis not present

## 2023-11-20 DIAGNOSIS — M722 Plantar fascial fibromatosis: Secondary | ICD-10-CM | POA: Diagnosis not present

## 2023-11-20 DIAGNOSIS — M5416 Radiculopathy, lumbar region: Secondary | ICD-10-CM | POA: Diagnosis not present

## 2023-11-20 DIAGNOSIS — M9903 Segmental and somatic dysfunction of lumbar region: Secondary | ICD-10-CM | POA: Diagnosis not present

## 2023-12-03 NOTE — Progress Notes (Signed)
Cardiology Office Note:  .   Date:  12/15/2023  ID:  Christine Bell, DOB Feb 07, 1966, MRN [id number] PCP: Gweneth Dimitri, MD  [facility] Providers Cardiologist:  Lesleigh Noe, MD (Inactive)    History of Present Illness: .   Christine Bell is a 58 y.o. female.  Discussed the use of AI scribe software for clinical note transcription with the patient, who gave verbal consent to proceed.  History of Present Illness   The patient, with a known history of mitral valve prolapse and mild to moderate mitral valve regurgitation, presents for a routine follow-up. She reports an episode of increased fatigue and heaviness in the legs approximately six to eight months ago. The patient managed these symptoms by increasing her physical activity and reducing alcohol intake, which led to a resolution of the symptoms. The patient denies any recent episodes of fatigue, shortness of breath, or other cardiac symptoms.  In the past, the patient has experienced occasional PVCs and associated lightheadedness, which were monitored with a two-week Holter monitor. The patient reports that these episodes of lightheadedness were more frequent during periods of high stress. The patient denies any recent episodes of lightheadedness or other symptoms suggestive of arrhythmia.        ROS: negative except per HPI above.  Studies Reviewed: .        Results   LABS LDL: 108 (12/2021)  DIAGNOSTIC EKG: Normal rhythm, incomplete right bundle branch block (12/15/2023) Holter monitor: Occasional PVCs (2020) Echocardiogram: Mild to moderate mitral valve regurgitation, no definite mitral annular disjunction (10/2022)     Risk Assessment/Calculations:             Physical Exam:   VS:  BP 113/74   Pulse (!) 56   Ht 5\' 9"  (1.753 m)   Wt 146 lb (66.2 kg)   SpO2 99%   BMI 21.56 kg/m    Wt Readings from Last 3 Encounters:  12/15/23 146 lb (66.2 kg)  10/18/22 151 lb 13.6 oz (68.9 kg)  07/17/19 152 lb  12.8 oz (69.3 kg)     Physical Exam   VITALS: P- 60s CHEST: Lungs clear to auscultation. CARDIOVASCULAR: Mild systolic click with systolic murmur graded 1-2 out of 6, audible throughout the chest with increased prominence near the apex.     GEN: Well nourished, well developed in no acute distress NECK: No JVD; No carotid bruits RESPIRATORY:  Clear to auscultation without rales, wheezing or rhonchi  ABDOMEN: Soft, non-tender, non-distended EXTREMITIES:  No edema; No deformity   ASSESSMENT AND PLAN: .    Assessment & Plan Nonrheumatic mitral valve regurgitation  Mitral valve prolapse  Incomplete RBBB   Assessment and Plan    Mitral Valve Prolapse with Mild to Moderate Mitral Valve Regurgitation No new symptoms. Fatigue and heavy legs resolved with increased exercise and reduced alcohol intake. EKG shows normal rhythm with incomplete right bundle branch block, unchanged from previous. No history of fainting or significant arrhythmias. Family history of mitral valve replacement in father. -Order echocardiogram to assess mitral valve and heart muscle function. -Continue monitoring for symptoms of worsening condition, including shortness of breath and lightheadedness.  Incomplete Right Bundle Branch Block Longstanding, likely congenital. No associated symptoms or dangerous arrhythmias. -Monitor during routine cardiac evaluations.  Hyperlipidemia Last LDL 108 in February 2023. No current medication for cholesterol. -Primary care physician to manage and monitor cholesterol levels. Aim for LDL less than 100.  General Health Maintenance -Continue regular exercise and reduced alcohol  intake. -Annual follow-up appointment or sooner if symptoms arise.

## 2023-12-11 DIAGNOSIS — M9903 Segmental and somatic dysfunction of lumbar region: Secondary | ICD-10-CM | POA: Diagnosis not present

## 2023-12-11 DIAGNOSIS — M722 Plantar fascial fibromatosis: Secondary | ICD-10-CM | POA: Diagnosis not present

## 2023-12-11 DIAGNOSIS — M9906 Segmental and somatic dysfunction of lower extremity: Secondary | ICD-10-CM | POA: Diagnosis not present

## 2023-12-11 DIAGNOSIS — M5416 Radiculopathy, lumbar region: Secondary | ICD-10-CM | POA: Diagnosis not present

## 2023-12-15 ENCOUNTER — Encounter: Payer: Self-pay | Admitting: Internal Medicine

## 2023-12-15 ENCOUNTER — Ambulatory Visit: Payer: BC Managed Care – PPO | Attending: Internal Medicine | Admitting: Internal Medicine

## 2023-12-15 VITALS — BP 113/74 | HR 56 | Ht 69.0 in | Wt 146.0 lb

## 2023-12-15 DIAGNOSIS — I4510 Unspecified right bundle-branch block: Secondary | ICD-10-CM | POA: Diagnosis not present

## 2023-12-15 DIAGNOSIS — I341 Nonrheumatic mitral (valve) prolapse: Secondary | ICD-10-CM | POA: Diagnosis not present

## 2023-12-15 DIAGNOSIS — I340 Nonrheumatic mitral (valve) insufficiency: Secondary | ICD-10-CM | POA: Diagnosis not present

## 2023-12-15 NOTE — Patient Instructions (Signed)
Medication Instructions:  Your physician recommends that you continue on your current medications as directed. Please refer to the Current Medication list given to you today.  *If you need a refill on your cardiac medications before your next appointment, please call your pharmacy*  Lab Work: None  Testing/Procedures: Your physician has requested that you have an echocardiogram. Echocardiography is a painless test that uses sound waves to create images of your heart. It provides your doctor with information about the size and shape of your heart and how well your heart's chambers and valves are working. This procedure takes approximately one hour. There are no restrictions for this procedure. Please do NOT wear cologne, perfume, aftershave, or lotions (deodorant is allowed). Please arrive 15 minutes prior to your appointment time. This will take place at [address] [place]    Follow-Up: At [facility], you and your health needs are our priority.  As part of our continuing mission to provide you with exceptional heart care, we have created designated Provider Care Teams.  These Care Teams include your primary Cardiologist (physician) and Advanced Practice Providers (APPs -  Physician Assistants and Nurse Practitioners) who all work together to provide you with the care you need, when you need it.   Your next appointment:   12 month(s)  Provider:   Parke Poisson, MD

## 2024-01-09 ENCOUNTER — Encounter (HOSPITAL_COMMUNITY): Payer: Self-pay

## 2024-01-10 ENCOUNTER — Ambulatory Visit (HOSPITAL_COMMUNITY): Payer: BC Managed Care – PPO | Attending: Internal Medicine

## 2024-01-10 ENCOUNTER — Ambulatory Visit (HOSPITAL_COMMUNITY): Payer: BC Managed Care – PPO

## 2024-01-10 DIAGNOSIS — E7800 Pure hypercholesterolemia, unspecified: Secondary | ICD-10-CM | POA: Diagnosis not present

## 2024-01-10 DIAGNOSIS — I341 Nonrheumatic mitral (valve) prolapse: Secondary | ICD-10-CM | POA: Insufficient documentation

## 2024-01-10 DIAGNOSIS — Z131 Encounter for screening for diabetes mellitus: Secondary | ICD-10-CM | POA: Diagnosis not present

## 2024-01-10 LAB — ECHOCARDIOGRAM COMPLETE
Area-P 1/2: 2.33 cm2
P 1/2 time: 570 ms
S' Lateral: 2.8 cm

## 2024-01-11 ENCOUNTER — Other Ambulatory Visit: Payer: Self-pay | Admitting: *Deleted

## 2024-01-11 ENCOUNTER — Encounter: Payer: Self-pay | Admitting: *Deleted

## 2024-01-11 DIAGNOSIS — I341 Nonrheumatic mitral (valve) prolapse: Secondary | ICD-10-CM

## 2024-01-11 DIAGNOSIS — I340 Nonrheumatic mitral (valve) insufficiency: Secondary | ICD-10-CM

## 2024-01-15 DIAGNOSIS — M9906 Segmental and somatic dysfunction of lower extremity: Secondary | ICD-10-CM | POA: Diagnosis not present

## 2024-01-15 DIAGNOSIS — Z0000 Encounter for general adult medical examination without abnormal findings: Secondary | ICD-10-CM | POA: Diagnosis not present

## 2024-01-15 DIAGNOSIS — M722 Plantar fascial fibromatosis: Secondary | ICD-10-CM | POA: Diagnosis not present

## 2024-01-15 DIAGNOSIS — F3342 Major depressive disorder, recurrent, in full remission: Secondary | ICD-10-CM | POA: Diagnosis not present

## 2024-01-15 DIAGNOSIS — M5416 Radiculopathy, lumbar region: Secondary | ICD-10-CM | POA: Diagnosis not present

## 2024-01-15 DIAGNOSIS — M9903 Segmental and somatic dysfunction of lumbar region: Secondary | ICD-10-CM | POA: Diagnosis not present

## 2024-01-15 DIAGNOSIS — R21 Rash and other nonspecific skin eruption: Secondary | ICD-10-CM | POA: Diagnosis not present

## 2024-01-15 DIAGNOSIS — I872 Venous insufficiency (chronic) (peripheral): Secondary | ICD-10-CM | POA: Diagnosis not present

## 2024-01-24 ENCOUNTER — Other Ambulatory Visit (HOSPITAL_COMMUNITY): Payer: BC Managed Care – PPO

## 2024-03-14 DIAGNOSIS — M654 Radial styloid tenosynovitis [de Quervain]: Secondary | ICD-10-CM | POA: Diagnosis not present

## 2024-04-16 DIAGNOSIS — D2371 Other benign neoplasm of skin of right lower limb, including hip: Secondary | ICD-10-CM | POA: Diagnosis not present

## 2024-04-16 DIAGNOSIS — D2272 Melanocytic nevi of left lower limb, including hip: Secondary | ICD-10-CM | POA: Diagnosis not present

## 2024-04-16 DIAGNOSIS — L821 Other seborrheic keratosis: Secondary | ICD-10-CM | POA: Diagnosis not present

## 2024-04-16 DIAGNOSIS — Z85828 Personal history of other malignant neoplasm of skin: Secondary | ICD-10-CM | POA: Diagnosis not present

## 2024-04-25 DIAGNOSIS — N6311 Unspecified lump in the right breast, upper outer quadrant: Secondary | ICD-10-CM | POA: Diagnosis not present

## 2024-04-25 DIAGNOSIS — Z6822 Body mass index (BMI) 22.0-22.9, adult: Secondary | ICD-10-CM | POA: Diagnosis not present

## 2024-04-29 ENCOUNTER — Other Ambulatory Visit: Payer: Self-pay | Admitting: Family Medicine

## 2024-04-29 DIAGNOSIS — N6310 Unspecified lump in the right breast, unspecified quadrant: Secondary | ICD-10-CM

## 2024-04-30 ENCOUNTER — Ambulatory Visit
Admission: RE | Admit: 2024-04-30 | Discharge: 2024-04-30 | Disposition: A | Discharge status: Home / Self Care | Source: Ambulatory Visit | Attending: Family Medicine | Admitting: Family Medicine

## 2024-04-30 DIAGNOSIS — N6310 Unspecified lump in the right breast, unspecified quadrant: Secondary | ICD-10-CM

## 2024-05-23 ENCOUNTER — Other Ambulatory Visit

## 2024-05-23 ENCOUNTER — Encounter

## 2024-06-07 ENCOUNTER — Encounter: Payer: Self-pay | Admitting: Advanced Practice Midwife

## 2024-07-15 ENCOUNTER — Other Ambulatory Visit: Payer: Self-pay | Admitting: Family Medicine

## 2024-07-15 DIAGNOSIS — Z1231 Encounter for screening mammogram for malignant neoplasm of breast: Secondary | ICD-10-CM

## 2024-09-06 ENCOUNTER — Ambulatory Visit
Admission: RE | Admit: 2024-09-06 | Discharge: 2024-09-06 | Disposition: A | Discharge status: Home / Self Care | Source: Ambulatory Visit | Attending: Family Medicine | Admitting: Family Medicine

## 2024-09-06 DIAGNOSIS — Z1231 Encounter for screening mammogram for malignant neoplasm of breast: Secondary | ICD-10-CM

## 2024-09-10 DIAGNOSIS — M654 Radial styloid tenosynovitis [de Quervain]: Secondary | ICD-10-CM | POA: Diagnosis not present

## 2024-09-30 DIAGNOSIS — J019 Acute sinusitis, unspecified: Secondary | ICD-10-CM | POA: Diagnosis not present

## 2024-10-04 DIAGNOSIS — E041 Nontoxic single thyroid nodule: Secondary | ICD-10-CM | POA: Diagnosis not present

## 2024-11-08 ENCOUNTER — Encounter: Payer: Self-pay | Admitting: Internal Medicine

## 2024-11-20 ENCOUNTER — Ambulatory Visit (HOSPITAL_COMMUNITY)
Admission: RE | Admit: 2024-11-20 | Discharge: 2024-11-20 | Disposition: A | Discharge status: Home / Self Care | Source: Ambulatory Visit | Attending: Internal Medicine | Admitting: Internal Medicine

## 2024-11-20 DIAGNOSIS — I341 Nonrheumatic mitral (valve) prolapse: Secondary | ICD-10-CM

## 2024-11-20 DIAGNOSIS — I340 Nonrheumatic mitral (valve) insufficiency: Secondary | ICD-10-CM

## 2024-11-20 LAB — ECHOCARDIOGRAM COMPLETE
Area-P 1/2: 4.74 cm2
S' Lateral: 3.07 cm

## 2024-11-21 ENCOUNTER — Ambulatory Visit: Payer: Self-pay | Admitting: Internal Medicine
